# Patient Record
Sex: Male | Born: 1940 | Race: Black or African American | Hispanic: No | Marital: Married | Smoking: Former smoker
Health system: Southern US, Community
[De-identification: ages and names within clinical notes are randomized; demographics above are authoritative.]

## PROBLEM LIST (undated history)

## (undated) DIAGNOSIS — N4289 Other specified disorders of prostate: Secondary | ICD-10-CM

## (undated) DIAGNOSIS — Z960 Presence of urogenital implants: Secondary | ICD-10-CM

## (undated) DIAGNOSIS — J449 Chronic obstructive pulmonary disease, unspecified: Secondary | ICD-10-CM

## (undated) DIAGNOSIS — R339 Retention of urine, unspecified: Secondary | ICD-10-CM

## (undated) DIAGNOSIS — F419 Anxiety disorder, unspecified: Secondary | ICD-10-CM

## (undated) DIAGNOSIS — F329 Major depressive disorder, single episode, unspecified: Secondary | ICD-10-CM

## (undated) DIAGNOSIS — C61 Malignant neoplasm of prostate: Secondary | ICD-10-CM

## (undated) DIAGNOSIS — G610 Guillain-Barre syndrome: Secondary | ICD-10-CM

## (undated) DIAGNOSIS — E119 Type 2 diabetes mellitus without complications: Secondary | ICD-10-CM

## (undated) DIAGNOSIS — Z9189 Other specified personal risk factors, not elsewhere classified: Secondary | ICD-10-CM

## (undated) DIAGNOSIS — F32A Depression, unspecified: Secondary | ICD-10-CM

## (undated) DIAGNOSIS — Z978 Presence of other specified devices: Secondary | ICD-10-CM

## (undated) DIAGNOSIS — N210 Calculus in bladder: Secondary | ICD-10-CM

## (undated) DIAGNOSIS — Z87898 Personal history of other specified conditions: Secondary | ICD-10-CM

## (undated) DIAGNOSIS — I10 Essential (primary) hypertension: Secondary | ICD-10-CM

## (undated) HISTORY — DX: Malignant neoplasm of prostate: C61

## (undated) HISTORY — DX: Anxiety disorder, unspecified: F41.9

## (undated) HISTORY — DX: Depression, unspecified: F32.A

## (undated) HISTORY — DX: Essential (primary) hypertension: I10

## (undated) HISTORY — DX: Personal history of other specified conditions: Z87.898

## (undated) HISTORY — DX: Major depressive disorder, single episode, unspecified: F32.9

---

## 2000-04-22 DIAGNOSIS — G610 Guillain-Barre syndrome: Secondary | ICD-10-CM

## 2000-04-22 HISTORY — PX: CRANIOTOMY FOR TUMOR: SUR345

## 2000-04-22 HISTORY — DX: Guillain-Barre syndrome: G61.0

## 2013-06-14 ENCOUNTER — Ambulatory Visit
Admission: RE | Admit: 2013-06-14 | Discharge: 2013-06-14 | Disposition: A | Discharge status: Home / Self Care | Payer: Commercial Managed Care - HMO | Source: Ambulatory Visit | Attending: Family | Admitting: Family

## 2013-06-14 ENCOUNTER — Other Ambulatory Visit: Payer: Self-pay | Admitting: Family

## 2013-06-14 DIAGNOSIS — M1990 Unspecified osteoarthritis, unspecified site: Secondary | ICD-10-CM

## 2014-08-03 ENCOUNTER — Other Ambulatory Visit: Payer: Self-pay | Admitting: Urology

## 2014-08-17 ENCOUNTER — Encounter (HOSPITAL_BASED_OUTPATIENT_CLINIC_OR_DEPARTMENT_OTHER): Payer: Self-pay | Admitting: *Deleted

## 2014-08-18 NOTE — Progress Notes (Signed)
UNABLE TO REACH PT .  LM ON #[phone] TO ARRIVE AT 0830. NPO AFTER MN AND DO FLEET ENEMA IN AM.

## 2014-08-19 ENCOUNTER — Encounter (HOSPITAL_BASED_OUTPATIENT_CLINIC_OR_DEPARTMENT_OTHER): Payer: Self-pay | Admitting: *Deleted

## 2014-08-19 ENCOUNTER — Ambulatory Visit (HOSPITAL_BASED_OUTPATIENT_CLINIC_OR_DEPARTMENT_OTHER): Payer: Medicare Other | Admitting: Anesthesiology

## 2014-08-19 ENCOUNTER — Other Ambulatory Visit: Payer: Self-pay

## 2014-08-19 ENCOUNTER — Ambulatory Visit (HOSPITAL_BASED_OUTPATIENT_CLINIC_OR_DEPARTMENT_OTHER)
Admission: RE | Admit: 2014-08-19 | Discharge: 2014-08-19 | Disposition: A | Discharge status: Home / Self Care | Payer: Medicare Other | Source: Ambulatory Visit | Attending: Urology | Admitting: Urology

## 2014-08-19 ENCOUNTER — Encounter (HOSPITAL_BASED_OUTPATIENT_CLINIC_OR_DEPARTMENT_OTHER)
Admission: RE | Disposition: A | Discharge status: Home / Self Care | Payer: Self-pay | Source: Ambulatory Visit | Attending: Urology

## 2014-08-19 DIAGNOSIS — F419 Anxiety disorder, unspecified: Secondary | ICD-10-CM | POA: Insufficient documentation

## 2014-08-19 DIAGNOSIS — R351 Nocturia: Secondary | ICD-10-CM | POA: Insufficient documentation

## 2014-08-19 DIAGNOSIS — J45909 Unspecified asthma, uncomplicated: Secondary | ICD-10-CM | POA: Insufficient documentation

## 2014-08-19 DIAGNOSIS — G709 Myoneural disorder, unspecified: Secondary | ICD-10-CM | POA: Diagnosis not present

## 2014-08-19 DIAGNOSIS — N401 Enlarged prostate with lower urinary tract symptoms: Secondary | ICD-10-CM | POA: Insufficient documentation

## 2014-08-19 DIAGNOSIS — Z801 Family history of malignant neoplasm of trachea, bronchus and lung: Secondary | ICD-10-CM | POA: Insufficient documentation

## 2014-08-19 DIAGNOSIS — F17200 Nicotine dependence, unspecified, uncomplicated: Secondary | ICD-10-CM | POA: Diagnosis not present

## 2014-08-19 DIAGNOSIS — R3912 Poor urinary stream: Secondary | ICD-10-CM | POA: Insufficient documentation

## 2014-08-19 DIAGNOSIS — J449 Chronic obstructive pulmonary disease, unspecified: Secondary | ICD-10-CM | POA: Insufficient documentation

## 2014-08-19 DIAGNOSIS — E119 Type 2 diabetes mellitus without complications: Secondary | ICD-10-CM | POA: Insufficient documentation

## 2014-08-19 DIAGNOSIS — Z79899 Other long term (current) drug therapy: Secondary | ICD-10-CM | POA: Insufficient documentation

## 2014-08-19 DIAGNOSIS — R972 Elevated prostate specific antigen [PSA]: Secondary | ICD-10-CM | POA: Diagnosis present

## 2014-08-19 DIAGNOSIS — H409 Unspecified glaucoma: Secondary | ICD-10-CM | POA: Insufficient documentation

## 2014-08-19 DIAGNOSIS — R3915 Urgency of urination: Secondary | ICD-10-CM | POA: Diagnosis not present

## 2014-08-19 DIAGNOSIS — Z8669 Personal history of other diseases of the nervous system and sense organs: Secondary | ICD-10-CM | POA: Diagnosis not present

## 2014-08-19 DIAGNOSIS — C61 Malignant neoplasm of prostate: Secondary | ICD-10-CM | POA: Diagnosis not present

## 2014-08-19 DIAGNOSIS — F329 Major depressive disorder, single episode, unspecified: Secondary | ICD-10-CM | POA: Insufficient documentation

## 2014-08-19 DIAGNOSIS — I10 Essential (primary) hypertension: Secondary | ICD-10-CM | POA: Insufficient documentation

## 2014-08-19 HISTORY — DX: Type 2 diabetes mellitus without complications: E11.9

## 2014-08-19 HISTORY — DX: Guillain-Barre syndrome: G61.0

## 2014-08-19 HISTORY — DX: Other specified disorders of prostate: N42.89

## 2014-08-19 HISTORY — DX: Chronic obstructive pulmonary disease, unspecified: J44.9

## 2014-08-19 HISTORY — PX: PROSTATE BIOPSY: SHX241

## 2014-08-19 LAB — POCT I-STAT 4, (NA,K, GLUC, HGB,HCT)
Glucose, Bld: 114 mg/dL — ABNORMAL HIGH (ref 70–99)
HEMATOCRIT: 38 % — AB (ref 39.0–52.0)
HEMOGLOBIN: 12.9 g/dL — AB (ref 13.0–17.0)
Potassium: 4.1 mmol/L (ref 3.5–5.1)
Sodium: 141 mmol/L (ref 135–145)

## 2014-08-19 LAB — GLUCOSE, CAPILLARY: GLUCOSE-CAPILLARY: 99 mg/dL (ref 70–99)

## 2014-08-19 SURGERY — BIOPSY, PROSTATE, RECTAL APPROACH, WITH US GUIDANCE
Anesthesia: General | Site: Prostate

## 2014-08-19 MED ORDER — LIDOCAINE HCL (CARDIAC) 20 MG/ML IV SOLN
INTRAVENOUS | Status: DC | PRN
  Administered 2014-08-19: 60 mg via INTRAVENOUS

## 2014-08-19 MED ORDER — DEXAMETHASONE SODIUM PHOSPHATE 4 MG/ML IJ SOLN
INTRAMUSCULAR | Status: DC | PRN
  Administered 2014-08-19: 5 mg via INTRAVENOUS

## 2014-08-19 MED ORDER — FENTANYL CITRATE (PF) 100 MCG/2ML IJ SOLN
25.0000 ug | INTRAMUSCULAR | Status: DC | PRN
  Filled 2014-08-19: qty 1

## 2014-08-19 MED ORDER — CEFTRIAXONE SODIUM IN DEXTROSE 20 MG/ML IV SOLN
1.0000 g | INTRAVENOUS | Status: AC
  Administered 2014-08-19: 1 g via INTRAVENOUS
  Filled 2014-08-19: qty 50

## 2014-08-19 MED ORDER — EPHEDRINE SULFATE 50 MG/ML IJ SOLN
INTRAMUSCULAR | Status: DC | PRN
  Administered 2014-08-19: 10 mg via INTRAVENOUS

## 2014-08-19 MED ORDER — ONDANSETRON HCL 4 MG/2ML IJ SOLN
4.0000 mg | Freq: Four times a day (QID) | INTRAMUSCULAR | Status: DC | PRN
  Filled 2014-08-19: qty 2

## 2014-08-19 MED ORDER — PROPOFOL 10 MG/ML IV BOLUS
INTRAVENOUS | Status: DC | PRN
  Administered 2014-08-19: 150 mg via INTRAVENOUS

## 2014-08-19 MED ORDER — FENTANYL CITRATE (PF) 100 MCG/2ML IJ SOLN
INTRAMUSCULAR | Status: DC | PRN
  Administered 2014-08-19: 50 ug via INTRAVENOUS

## 2014-08-19 MED ORDER — CEFTRIAXONE SODIUM 1 G IJ SOLR
INTRAMUSCULAR | Status: AC
  Filled 2014-08-19: qty 10

## 2014-08-19 MED ORDER — LACTATED RINGERS IV SOLN
INTRAVENOUS | Status: DC
  Administered 2014-08-19: 09:00:00 via INTRAVENOUS
  Filled 2014-08-19: qty 1000

## 2014-08-19 MED ORDER — FENTANYL CITRATE (PF) 100 MCG/2ML IJ SOLN
INTRAMUSCULAR | Status: AC
  Filled 2014-08-19: qty 2

## 2014-08-19 MED ORDER — ONDANSETRON HCL 4 MG/2ML IJ SOLN
INTRAMUSCULAR | Status: DC | PRN
  Administered 2014-08-19: 4 mg via INTRAVENOUS

## 2014-08-19 SURGICAL SUPPLY — 18 items
BAG URINE LEG 19OZ MD ST LTX (BAG) IMPLANT
CATH FOLEY 2WAY SLVR  5CC 16FR (CATHETERS)
CATH FOLEY 2WAY SLVR 5CC 16FR (CATHETERS) IMPLANT
DRSG TEGADERM 4X4.75 (GAUZE/BANDAGES/DRESSINGS) IMPLANT
DRSG TEGADERM 8X12 (GAUZE/BANDAGES/DRESSINGS) IMPLANT
DRSG TELFA 3X8 NADH (GAUZE/BANDAGES/DRESSINGS) ×3 IMPLANT
GAUZE SPONGE 4X4 12PLY STRL LF (GAUZE/BANDAGES/DRESSINGS) IMPLANT
GLOVE BIO SURGEON STRL SZ7 (GLOVE) ×3 IMPLANT
NDL SAFETY ECLIPSE 18X1.5 (NEEDLE) IMPLANT
NEEDLE HYPO 18GX1.5 SHARP (NEEDLE)
NEEDLE SPNL 22GX7 QUINCKE BK (NEEDLE) IMPLANT
PLUG CATH AND CAP STER (CATHETERS) IMPLANT
SET IRRIG Y TYPE TUR BLADDER L (SET/KITS/TRAYS/PACK) IMPLANT
SURGILUBE 2OZ TUBE FLIPTOP (MISCELLANEOUS) ×3 IMPLANT
SYR 20CC LL (SYRINGE) IMPLANT
SYRINGE 10CC LL (SYRINGE) IMPLANT
TOWEL OR 17X24 6PK STRL BLUE (TOWEL DISPOSABLE) ×3 IMPLANT
UNDERPAD 30X30 INCONTINENT (UNDERPADS AND DIAPERS) ×3 IMPLANT

## 2014-08-19 NOTE — Discharge Instructions (Signed)

## 2014-08-19 NOTE — Transfer of Care (Signed)
Last Vitals:  Filed Vitals:   08/19/14 0748  BP: 139/81  Pulse: 55  Temp: 36.6 C  Resp: 20    Immediate Anesthesia Transfer of Care Note  Patient: Derek Tanner  Procedure(s) Performed: Procedure(s) (LRB): BIOPSY TRANSRECTAL ULTRASONIC PROSTATE (TUBP) (N/A)  Patient Location: PACU  Anesthesia Type: General  Level of Consciousness: awake, alert  and oriented  Airway & Oxygen Therapy: Patient Spontanous Breathing and Patient connected to nasal cannula oxygen  Post-op Assessment: Report given to PACU RN and Post -op Vital signs reviewed and stable  Post vital signs: Reviewed and stable  Complications: No apparent anesthesia complications

## 2014-08-19 NOTE — Anesthesia Procedure Notes (Addendum)
Date/Time: 08/19/2014 10:30 AM Performed by: Mechele Claude   Procedure Name: LMA Insertion Date/Time: 08/19/2014 10:30 AM Performed by: Mechele Claude Pre-anesthesia Checklist: Patient identified, Emergency Drugs available, Suction available and Patient being monitored Patient Re-evaluated:Patient Re-evaluated prior to inductionOxygen Delivery Method: Circle System Utilized Preoxygenation: Pre-oxygenation with 100% oxygen Intubation Type: IV induction Ventilation: Mask ventilation without difficulty LMA: LMA inserted LMA Size: 4.0 Number of attempts: 1 Airway Equipment and Method: bite block Placement Confirmation: positive ETCO2 Tube secured with: Tape Dental Injury: Teeth and Oropharynx as per pre-operative assessment

## 2014-08-19 NOTE — H&P (Signed)
History of Present Illness Derek Tanner is referred by Dr Iona Beard Osei-Bonsu. His PSA is mildly elevated at 5.6. Free PSA is 7.1%. Derek Tanner relocated to [place] from [place] 5 years ago. He has not had a PSA. He does not have a family history of prostate cancer. He has frequency, urgency, weak stream, nocturia x 4-5. He had a colonoscopy 4 months ago that revealed some benign polys as per his wife.   Past Medical History Problems  1. History of Anxiety (F41.9) 2. 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 History of asthma (Z87.09) 4. History of cardiac arrhythmia (Z86.79) 5. History of depression (Z86.59) 6. History of diabetes mellitus (Z86.39) 7. History of glaucoma (Z86.69) 8. History of hypertension (Z86.79) 9. History of sleep apnea (Z87.09)  Surgical History Problems  1. History of Brain Surgery  Current Meds 1. Lisinopril TABS;  Therapy: (Recorded:31Dec2015) to Recorded 2. Meloxicam TABS;  Therapy: (Recorded:31Dec2015) to Recorded 3. MetFORMIN HCl TABS;  Therapy: (Recorded:31Dec2015) to Recorded 4. ProAir HFA AERS;  Therapy: (Recorded:31Dec2015) to Recorded  Allergies Medication  1. No Known Drug Allergies  Family History Problems  1. Family history of Throat cancer : Brother  Social History Problems    Denied: History of Alcohol use   Caffeine use (F15.90)   Current smoker (F57.200)   Father deceased   Married   Mother deceased  Review of Systems Genitourinary, constitutional, skin, eye, otolaryngeal, hematologic/lymphatic, cardiovascular, pulmonary, endocrine, musculoskeletal, gastrointestinal, neurological and psychiatric system(s) were reviewed and pertinent findings if present are noted and are otherwise negative.  Genitourinary: urinary frequency, feelings of urinary urgency, nocturia, weak urinary stream and erectile dysfunction.  Gastrointestinal: heartburn and diarrhea.  Constitutional: feeling tired (fatigue) and recent  weight loss.  Integumentary: skin rash/lesion and pruritus.  Eyes: blurred vision and diplopia.  ENT: sore throat and sinus problems.  Cardiovascular: chest pain and leg swelling.  Respiratory: shortness of breath and cough.  Musculoskeletal: back pain and joint pain.  Neurological: dizziness.  Psychiatric: anxiety and depression.    Vitals Vital Signs [Data Includes: Last 1 Day]  Recorded: 31Dec2015 10:59AM  Height: 5 ft 7 in Weight: 159 lb  BMI Calculated: 24.9 BSA Calculated: 1.83 Blood Pressure: 131 / 70 Heart Rate: 60 Respiration: 18  Physical Exam Constitutional: Well nourished and well developed . No acute distress.  ENT:. The ears and nose are normal in appearance.  Neck: The appearance of the neck is normal and no neck mass is present.  Pulmonary: No respiratory distress and normal respiratory rhythm and effort.  Cardiovascular: Heart rate and rhythm are normal . No peripheral edema.  Abdomen: The abdomen is soft and nontender. No masses are palpated. No CVA tenderness. No hernias are palpable. No hepatosplenomegaly noted.  Rectal: Rectal exam demonstrates normal sphincter tone, no tenderness and no masses. Estimated prostate size is 2+. The prostate has no nodularity, is indurated involving the left, mid aspect of the prostate which appears to be confined within the prostate capsule and is not tender. The left seminal vesicle is nonpalpable. The right seminal vesicle is nonpalpable. The perineum is normal on inspection.  Genitourinary: Examination of the penis demonstrates no discharge, no masses, no lesions and a normal meatus. The penis is uncircumcised. The scrotum is without lesions. The right epididymis is palpably normal and non-tender. The left epididymis is palpably normal and non-tender. The right testis is non-tender and without masses. The left testis is non-tender and without masses.  Lymphatics: The femoral and inguinal nodes are not enlarged  or tender.  Skin:  Normal skin turgor, no visible rash and no visible skin lesions.  Neuro/Psych:. Mood and affect are appropriate.    Results/Data Urine [Data Includes: Last 1 Day]   [id number]  COLOR YELLOW   APPEARANCE CLEAR   SPECIFIC GRAVITY 1.015   pH 5.5   GLUCOSE NEG mg/dL  BILIRUBIN NEG   KETONE NEG mg/dL  BLOOD NEG   PROTEIN NEG mg/dL  UROBILINOGEN 0.2 mg/dL  NITRITE NEG   LEUKOCYTE ESTERASE NEG    Assessment Assessed  1. Elevated prostate specific antigen (PSA) (R97.2) 2. Benign localized prostatic hyperplasia with lower urinary tract symptoms (LUTS) (N40.1) 3. Nocturia (R35.1)  Plan Elevated prostate specific antigen (PSA)  1. Start: Levofloxacin 500 MG Oral Tablet (Levaquin); 1 tablet the day before the procedure,  1 tablet the day of the procedure, and 1 tablet the day after the procedure 2. PROSTATE BIOPSY ULTRASOUND; Status:Hold For - Date of Service; Requested  NLG:[id number];  3. Follow-up Week x 1 Office  Follow-up 1 week after biopsy  Status: Hold For - Date of  Service  Requested for: 01Feb2016 Health Maintenance  4. UA With REFLEX; [Do Not Release]; Status:Resulted - Requires Verification;   Done:  [id number] 09:31AM  Since his free PSA is low and total PSA is high and the digital rectal examination reveals an abnormal prostate he needs prostate biopsy. The procedure, risks, benefits were explained to the patient and his wife. The risks include but are not limited to hemorrhage: blood in the urine, stools or semen, infection, sepsis. They understand and wish to proceed.

## 2014-08-19 NOTE — Anesthesia Postprocedure Evaluation (Signed)
Anesthesia Post Note  Patient: Derek Tanner  Procedure(s) Performed: Procedure(s) (LRB): BIOPSY TRANSRECTAL ULTRASONIC PROSTATE (TUBP) (N/A)  Anesthesia type: General  Patient location: PACU  Post pain: Pain level controlled and Adequate analgesia  Post assessment: Post-op Vital signs reviewed, Patient's Cardiovascular Status Stable, Respiratory Function Stable, Patent Airway and Pain level controlled  Last Vitals:  Filed Vitals:   08/19/14 1115  BP: 132/56  Pulse: 55  Temp:   Resp: 13    Post vital signs: Reviewed and stable  Level of consciousness: awake, alert  and oriented  Complications: No apparent anesthesia complications

## 2014-08-19 NOTE — Op Note (Signed)
Derek Tanner is a 73 y.o.   08/19/2014  General  Preop diagnosis: Elevated PSA, abnormal digital rectal examination  Postop diagnosis: Same  Procedure done: Ultrasound guided prostate biopsy  Surgeon: Charlene Brooke. [name]  Technologist: Slaton Lions  Indication: Patient is a 74 years old male with an elevated PSA of 5.6 and 7% free PSA. Digital rectal examination showed an indurated left lobe of the prostate. He was scheduled for a prostate biopsy under local anesthesia. However he could not tolerate the transducer. He is now scheduled for ultrasound-guided prostate biopsy under general anesthesia  Procedure: The patient was identified by his wrist band and proper timeout was taken.  Under general anesthesia he was prepped and draped and placed in the left lateral decubitus position. A transducer was inserted in the rectum. The seminal vesicles appear normal. There is no evidence of hypoechoic nodules. The prostate measures a 4.46 cm in width, 3.5 cm in height and 4.5 cm in length. The volume of the prostate is 38.03 mL.  2 biopsies of the right base, 2 biopsies of the right mid gland, 2 biopsies of the right apex were done. The biopsies were done at the medial and lateral zones of the prostate. Then 2 biopsies of the left base, 2 biopsies of the left mid gland and 2 biopsies of the left apex were done.  There was no bleeding at the end of the procedure. The transducer was removed.  The patient tolerated the procedure well and left the OR in satisfactory condition to postanesthesia care unit.  EBL: Minimal  Needles, sponges count: Correct

## 2014-08-19 NOTE — Anesthesia Preprocedure Evaluation (Signed)
Anesthesia Evaluation  Patient identified by MRN, date of birth, ID band Patient awake    Reviewed: Allergy & Precautions, NPO status , Patient's Chart, lab work & pertinent test results  Airway Mallampati: II   Neck ROM: full    Dental   Pulmonary COPDCurrent Smoker,  breath sounds clear to auscultation        Cardiovascular negative cardio ROS  Rhythm:regular Rate:Normal     Neuro/Psych  Neuromuscular disease    GI/Hepatic   Endo/Other  diabetes, Type 2  Renal/GU      Musculoskeletal   Abdominal   Peds  Hematology   Anesthesia Other Findings   Reproductive/Obstetrics                             Anesthesia Physical Anesthesia Plan  ASA: II  Anesthesia Plan: General   Post-op Pain Management:    Induction: Intravenous  Airway Management Planned: LMA  Additional Equipment:   Intra-op Plan:   Post-operative Plan:   Informed Consent: I have reviewed the patients History and Physical, chart, labs and discussed the procedure including the risks, benefits and alternatives for the proposed anesthesia with the patient or authorized representative who has indicated his/her understanding and acceptance.     Plan Discussed with: CRNA, Anesthesiologist and Surgeon  Anesthesia Plan Comments:         Anesthesia Quick Evaluation

## 2014-08-22 ENCOUNTER — Encounter (HOSPITAL_BASED_OUTPATIENT_CLINIC_OR_DEPARTMENT_OTHER): Payer: Self-pay | Admitting: Urology

## 2014-09-08 ENCOUNTER — Other Ambulatory Visit: Payer: Self-pay | Admitting: Physician Assistant

## 2014-09-08 DIAGNOSIS — M542 Cervicalgia: Secondary | ICD-10-CM

## 2014-09-13 ENCOUNTER — Encounter: Payer: Self-pay | Admitting: Radiation Oncology

## 2014-09-13 NOTE — Progress Notes (Signed)
GU Location of Tumor / Histology: prostatic adenocarcinoma  If Prostate Cancer, Gleason Score is (4 + 3) and PSA is (5.6)  Derek Tanner was referred by Dr. Vista Lawman to Dr. Janice Norrie in April 2016 for a mildly elevated PSA. Derek Tanner relocated to [place] from [place] five years ago.  Biopsies of prostate (if applicable) revealed:  Diagnosis 1. Prostate, needle biopsy(ies), right base lateral - PROSTATIC ADENOCARCINOMA, GLEASON'S SCORE 3+4=7, PROGNOSTIC GRADE GROUP II, INVOLVING 45% OF THE TISSUE, ONE OF ONE CORE. - NO PERINEURAL INVASION IDENTIFIED. 2. Prostate, needle biopsy(ies), right base medial - PROSTATIC ADENOCARCINOMA, GLEASON'S SCORE 3+4=7, PROGNOSTIC GRADE GROUP II., INVOLVING 40% OF THE TISSUE, ONE OF ONE CORE. - NO PERINEURAL INVASION IDENTIFIED. 3. Prostate, needle biopsy(ies), right mid lateral - PROSTATIC ADENOCARCINOMA, GLEASON'S SCORE 3+4=7, PROGNOSTIC GRADE GROUP II, INVOLVING 35% OF THE TISSUE, ONE OF ONE CORE. - NO PERINEURAL INVASION IDENTIFIED. 4. Prostate, needle biopsy(ies), right mid medial - PROSTATIC ADENOCARCINOMA, GLEASON'S SCORE 3+4=7, PROGNOSTIC GRADE GROUP II, INVOLVING LESS THAN 10% OF TISSUE, ONE OF ONE CORE. - NO PERINEURAL INVASION IDENTIFIED. 5. Prostate, needle biopsy(ies), right apex lateral - PROSTATIC ADENOCARCINOMA, GLEASON'S SCORE 3+4=7, PROGNOSTIC GRADE GROUP II, INVOLVING 35% OF THE TISSUE, ONE OF ONE CORE. - NO PERINEURAL INVASION IDENTIFIED. 6. Prostate, needle biopsy(ies), right apex medial - PROSTATIC ADENOCARCINOMA, GLEASON'S SCORE 3+4=7, PROGNOSTIC GRADE GROUP II, INVOLVING 30% OF THE TISSUE, ONE OF ONE CORE. - NO PERINEURAL INVASION IDENTIFIED. 7. Prostate, needle biopsy(ies), left base lateral - PROSTATIC ADENOCARCINOMA, GLEASON'S SCORE 4+3=7, PROGNOSTIC GRADE GROUP III, INVOLVING LESS THAN 10% OF THE TISSUE, ONE OF ONE CORE. - PERINEURAL INVASION PRESENT. 8. Prostate, needle biopsy(ies), left base medial - MICROSCOPIC  FOCUS OF PROSTATIC ADENOCARCINOMA, GLEASON'S SCORE 3+4=7, PROGNOSTIC GRADE GROUP II, INVOLVING LESS THAN 5% OF THE TISSUE, ONE OF ONE CORE. 1 of 3 - PERINEURAL INVASION PRESENT. 9. Prostate, needle biopsy(ies), left mid lateral - PROSTATIC ADENOCARCINOMA, GLEASON'S SCORE 3+4=7, PROGNOSTIC GRADE GROUP II, INVOLVING 25% OF THE TISSUE, ONE OF ONE CORE, WITH PERINEURAL INVASION PRESENT AND FOCAL AREA EXTENDING INTO THE PERIPROSTATIC FATTY TISSUE; PLEASE SEE COMMENT. 10. Prostate, needle biopsy(ies), left mid medial - PROSTATIC ADENOCARCINOMA, GLEASON'S SCORE 4+3=7, PROGNOSTIC GRADE GROUP III, INVOLVING 35% OF THE TISSUE, ONE OF ONE CORE, WITH EXTENSIVE PERINEURAL INVASION PRESENT. 11. Prostate, needle biopsy(ies), left apex lateral - PROSTATIC ADENOCARCINOMA, GLEASON 'S SCORE 4+3=7, PROGNOSTIC GRADE GROUP III, INVOLVING 90% OF THE TISSUE, ONE OF ONE CO RE. - EXTENSIVE PERINEURAL INVASION PRESENT. 12. Prostate, needle biopsy(ies), left apex medial - PROSTATIC ADENOCARCINOMA, GLEASON'S SCORE 4+3=7, PROGNOSTIC GRADE GROUP III, INVOLVING 80% OF THE TISSUE, ONE OF ONE CORE. - PERINEURAL INVASION PRESENT.  Past/Anticipated interventions by urology, if any: biopsy and referral to radiation oncology  Past/Anticipated interventions by medical oncology, if any: no  Weight changes, if any: no  Bowel/Bladder complaints, if any: frequency, urgency, weak stream, nocturia x 4-5   Nausea/Vomiting, if any: no  Pain issues, if any:  yes, back and joint pain  SAFETY ISSUES:  Prior radiation? no  Pacemaker/ICD? no  Possible current pregnancy? no  Is the patient on methotrexate? no  Current Complaints / other details:  74 year old male. Married. NKDA. Prostate volume 38.03 mL.

## 2014-09-14 ENCOUNTER — Ambulatory Visit
Admission: RE | Admit: 2014-09-14 | Discharge: 2014-09-14 | Disposition: A | Discharge status: Home / Self Care | Payer: Medicare Other | Source: Ambulatory Visit | Attending: Radiation Oncology | Admitting: Radiation Oncology

## 2014-09-14 VITALS — BP 129/64 | HR 65 | Resp 16 | Ht 67.0 in | Wt 166.9 lb

## 2014-09-14 DIAGNOSIS — C61 Malignant neoplasm of prostate: Secondary | ICD-10-CM | POA: Insufficient documentation

## 2014-09-14 NOTE — Progress Notes (Addendum)
     PSA 03/24/14     5.6  DRE revealed an indurated left lobe  08/19/14  TRUS under anesthesia 38.03 mL   Out of 12 biopsies, 12 were positive     4+3

## 2014-09-14 NOTE — Progress Notes (Signed)
See progress note under physician encounter. 

## 2014-09-14 NOTE — Progress Notes (Signed)
Radiation Oncology         [phone] ________________________________  Initial outpatient Consultation  Name: Derek Tanner MRN: [id number]  Date: 09/14/2014  DOB: 27-Feb-1941  CC:No primary care provider on file.  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    ICD-9-CM ICD-10-CM   1. Malignant neoplasm of prostate 185 C61     HISTORY OF PRESENT ILLNESS:Derek Tanner is a 74 y.o. gentleman.  He was noted to have an elevated PSA of 5.6 by his primary care physician, Dr. Vista Lawman on 03/24/2014.  Accordingly, he was referred for evaluation in urology by Dr. Janice Norrie on 04/21/2014,  digital rectal examination was performed at that time revealing an induratee left lobe.  The patient proceeded to transrectal ultrasound with 12 biopsies of the prostate on 08/19/2014.  The prostate volume measured 38.03 cc.  Out of 12 core biopsies,12 were positive.  The maximum Gleason score was 4+3, and this was seen in 4 specimens from the left side.  The patient reviewed the biopsy results with his urologist and he has kindly been referred today for discussion of potential radiation treatment options.  PREVIOUS RADIATION THERAPY: No  PAST MEDICAL HISTORY:  has a past medical history of Type 2 diabetes mellitus; Elevated PSA; Prostate induration; COPD (chronic obstructive pulmonary disease); Guillain-Barre disease (2002); Prostate cancer; Anxiety; Guillain-Barre syndrome; Asthma; History of prolonged Q-T interval on ECG; Depression; Hypertension; and Sleep apnea.    PAST SURGICAL HISTORY: Past Surgical History  Procedure Laterality Date  . Craniotomy for tumor  2002  . Prostate biopsy N/A 08/19/2014    Procedure: BIOPSY TRANSRECTAL ULTRASONIC PROSTATE (TUBP);  Surgeon: Lowella Bandy, MD;  Location: [facility];  Service: Urology;   Laterality: N/A;    FAMILY HISTORY: family history includes Cancer in his brother.  SOCIAL HISTORY:  reports that he has been smoking.  He has never used smokeless tobacco. He reports that he does not drink alcohol or use illicit drugs.  ALLERGIES: Review of patient's allergies indicates no known allergies.  MEDICATIONS:  Current Outpatient Prescriptions  Medication Sig Dispense Refill  . albuterol (PROVENTIL) (2.5 MG/3ML) 0.083% nebulizer solution Take 2.5 mg by nebulization as needed for wheezing or shortness of breath.    . Blood Glucose Monitoring Suppl (RA BLOOD GLUCOSE MONITOR) DEVI Frequency:3-4 times a day   Dosage:0.0     Instructions:Accu-Chek Aviva Plus, Use 1 Strip 3-4 times a day  Note:Include lancets, lancet device & control solution; diabetes 250.00    . fluticasone (FLONASE) 50 MCG/ACT nasal spray Place 1 spray into both nostrils as needed for allergies or rhinitis.    Marland Kitchen HYDROcodone-acetaminophen (NORCO) 7.5-325 MG per tablet Take 1 tablet by mouth every 6 (six) hours as needed.  0  . lisinopril (PRINIVIL,ZESTRIL) 10 MG tablet Take 10 mg by mouth daily.  1  . lisinopril (PRINIVIL,ZESTRIL) 30 MG tablet Take 30 mg by mouth.    . meloxicam (MOBIC) 7.5 MG tablet Take 7.5-15 mg by mouth.    . metFORMIN (GLUCOPHAGE) 500 MG tablet Take 500 mg by mouth 2 (two) times daily with a meal.    . terazosin (HYTRIN) 5 MG capsule   1  . triamcinolone cream (KENALOG) 0.1 % Apply topically.    . VENTOLIN HFA 108 (90 BASE) MCG/ACT inhaler   2  . tamsulosin (FLOMAX)  0.4 MG CAPS capsule Take 0.4 mg by mouth.     No current facility-administered medications for this encounter.    REVIEW OF SYSTEMS:  A 15 point review of systems is documented in the electronic medical record. This was obtained by the nursing staff. However, I reviewed this with the patient to discuss relevant findings and make appropriate changes.  A comprehensive review of systems was negative..  The patient completed an IPSS  and IIEF questionnaire.  His IPSS score was 22 indicating severe urinary outflow obstructive symptoms.  He indicated that his erectile function is unable to complete sexual activity.   PHYSICAL EXAM: This patient is in no acute distress.  He is alert and oriented.   height is 5\' 7"  (1.702 m) and weight is 166 lb 14.4 oz (75.705 kg). His blood pressure is 129/64 and his pulse is 65. His respiration is 16.  He exhibits no respiratory distress or labored breathing.  He appears neurologically intact.  His mood is pleasant.  His affect is appropriate.  Please note the digital rectal exam findings described above.  KPS = 100  100 - Normal; no complaints; no evidence of disease. 90   - Able to carry on normal activity; minor signs or symptoms of disease. 80   - Normal activity with effort; some signs or symptoms of disease. 62   - Cares for self; unable to carry on normal activity or to do active work. 60   - Requires occasional assistance, but is able to care for most of his personal needs. 50   - Requires considerable assistance and frequent medical care. 34   - Disabled; requires special care and assistance. 19   - Severely disabled; hospital admission is indicated although death not imminent. 24   - Very sick; hospital admission necessary; active supportive treatment necessary. 10   - Moribund; fatal processes progressing rapidly. 0     - Dead  Karnofsky DA, Abelmann Spaulding, Craver LS and Burchenal [facility] [phone]) The use of the nitrogen mustards in the palliative treatment of carcinoma: with particular reference to bronchogenic carcinoma Cancer 1 634-56   LABORATORY DATA:  Lab Results  Component Value Date   HGB 12.9* 08/19/2014   HCT 38.0* 08/19/2014   Lab Results  Component Value Date   NA 141 08/19/2014   K 4.1 08/19/2014   No results found for: ALT, AST, GGT, ALKPHOS, BILITOT   RADIOGRAPHY: No results found.    IMPRESSION: Derek Tanner is a 74 year old gentleman with stage T2a  adenocarcinoma of the prostate with a Gleason's score of 4+3 and a PSA of 5.6. His T-Stage, Gleason's Score, and PSA put him into the intermediate risk group.  Accordingly he is eligible for a variety of potential treatment options including surgery, external radiation treatments and hormone therapy.  PLAN: Today I reviewed the findings and workup thus far.  We discussed the natural history of prostate cancer.  We reviewed the the implications of T-stage, Gleason's Score, and PSA on decision-making and outcomes in prostate cancer.  We discussed radiation treatment in the management of prostate cancer with regard to the logistics and delivery of external beam radiation treatment as well as the logistics and delivery of prostate brachytherapy.  We compared and contrasted each of these approaches and also compared these against prostatectomy.  The patient expressed interest in external beam radiotherapy.  I filled out a patient counseling form for him with relevant treatment diagrams and we retained a copy for our  records.   We discussed hormone therapy in terms of side effects and potential benefits in conjunction with radiation.  The patient would like to proceed with prostate IMRT.  I will share my findings with Dr. Janice Norrie and move forward with scheduling initiation of hormone therapy and placement of three gold fiducial markers into the prostate to proceed with IMRT in the near future.     I enjoyed meeting with him today, and will look forward to participating in the care of this very nice gentleman.  Pursue radiation treatments and hormone therapy. Two hormone shots administered over a course of 6 months.  I spent 40 minutes face to face with the patient and more than 50% of that time was spent in counseling and/or coordination of care.  This document serves as a record of services personally performed by Tyler Pita, MD. It was created on his behalf by Lenn Cal, a trained medical scribe.  The creation of this record is based on the scribe's personal observations and the provider's statements to them. This document has been checked and approved by the attending provider.     ------------------------------------------------  Sheral Apley Tammi Klippel, M.D.

## 2014-09-27 ENCOUNTER — Other Ambulatory Visit: Payer: Medicare Other

## 2014-09-28 ENCOUNTER — Inpatient Hospital Stay: Admission: RE | Admit: 2014-09-28 | Payer: Medicare Other | Source: Ambulatory Visit

## 2014-10-03 ENCOUNTER — Ambulatory Visit
Admission: RE | Admit: 2014-10-03 | Discharge: 2014-10-03 | Disposition: A | Discharge status: Home / Self Care | Payer: Medicare Other | Source: Ambulatory Visit | Attending: Physician Assistant | Admitting: Physician Assistant

## 2014-10-03 DIAGNOSIS — M542 Cervicalgia: Secondary | ICD-10-CM

## 2014-10-11 ENCOUNTER — Telehealth: Payer: Self-pay | Admitting: *Deleted

## 2014-10-11 NOTE — Telephone Encounter (Signed)
CALLED PATIENT TO INFORM OF HORMONE INJ. FOR 10-13-14 - ARRIVAL TIME - 1:30 PM, GOLD SEED PLACEMENT ON 11-10-14 - ARRIVAL TIME - 7:45 AM @ DR. Janice Norrie' OFFICE, SIM APPT. - 12-02-14 @ 1 PM @ DR. MANNING'S OFFICE AND HE WILL START RT ON 12-13-14, NO ANSWER WILL CALL  LATER.

## 2014-10-17 ENCOUNTER — Other Ambulatory Visit: Payer: Self-pay | Admitting: Urology

## 2014-10-18 ENCOUNTER — Other Ambulatory Visit: Payer: Self-pay | Admitting: Urology

## 2014-10-26 ENCOUNTER — Encounter: Payer: Self-pay | Admitting: Radiation Oncology

## 2014-10-26 NOTE — Progress Notes (Signed)
October 13, 2014  Lupron scheduled November 10, 2014  Gold Seed placement December 02, 2014 CT/Simulation December 13, 2014  Radiation treatment

## 2014-11-08 ENCOUNTER — Encounter (HOSPITAL_BASED_OUTPATIENT_CLINIC_OR_DEPARTMENT_OTHER): Payer: Self-pay | Admitting: *Deleted

## 2014-11-09 ENCOUNTER — Encounter (HOSPITAL_BASED_OUTPATIENT_CLINIC_OR_DEPARTMENT_OTHER): Payer: Self-pay | Admitting: *Deleted

## 2014-11-09 NOTE — Progress Notes (Signed)
   11/09/14 1017  OBSTRUCTIVE SLEEP APNEA  Have you ever been diagnosed with sleep apnea through a sleep study? No  Do you snore loudly (loud enough to be heard through closed doors)?  0  Do you often feel tired, fatigued, or sleepy during the daytime? 0  Has anyone observed you stop breathing during your sleep? 1  Do you have, or are you being treated for high blood pressure? 1  BMI more than 35 kg/m2? 0  Age over 73 years old? 1  Neck circumference greater than 40 cm/16 inches? 0  Gender: 1  Obstructive Sleep Apnea Score 4

## 2014-11-09 NOTE — Progress Notes (Signed)
NPO AFTER MN.  ARRIVE AT 0715.  NEEDS ISTAT.  CURRENT EKG IN CHART AND EPIC. WILL BRING RESCUE INHALER.

## 2014-11-10 NOTE — H&P (Signed)
  History of Present Illness Mr Asman returns with his wife to discuss treatment of his prostate cancer. He has Gleason 3+4 and 4+3 in all cores. PSA is 5.6. 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 As per the [facility] nomograms he has 38% probability of organ confined disease, 59% risk of extracapsular extension, 8% risk of seminal vesicle invasion and 8% risk of lymph node involvement. I went over the treatment options with them. He is not a candidate for active surveillance. The options are: radical prostatectomy. They understand that he has a high risk of extraprostatic extension and in that case would need adjuvant radiation therapy, IMRT, cryoablation, HIFU. He is not a candidate for seeds implantation alone.  He is interested in radiation therapy. He will need 6-12 months of neoadjuvant androgen deprivation. He would like to have radiation therapy.   Past Medical History Problems  1. History of Anxiety (F41.9) 2. 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 History of asthma (Z87.09) 4. History of cardiac arrhythmia (Z86.79) 5. History of depression (Z86.59) 6. History of diabetes mellitus (Z86.39) 7. History of glaucoma (Z86.69) 8. History of hypertension (Z86.79) 9. History of sleep apnea (Z87.09)  Surgical History Problems  1. History of Biopsy Of The Prostate Needle 2. History of Brain Surgery  Current Meds 1. Lisinopril TABS;  Therapy: (Recorded:31Dec2015) to Recorded 2. Meloxicam TABS;  Therapy: (Recorded:31Dec2015) to Recorded 3. MetFORMIN HCl TABS;  Therapy: (Recorded:31Dec2015) to Recorded 4. ProAir HFA AERS;  Therapy: (Recorded:31Dec2015) to Recorded 5. Uribel 118 MG Oral Capsule; TAKE 1 CAPSULE 4 times daily;  Therapy: 12Apr2016 to (Last Rx:12Apr2016)  Requested for: 12Apr2016 Ordered  Allergies Medication  1. No Known Drug Allergies  Family History Problems  1. Family history of Throat cancer : Brother  Social History Problems  1.  Denied: History of Alcohol use 2. Caffeine use (F15.90) 3. Current smoker (F17.200) 4. Father deceased 48. Married 6. Mother deceased  Review of Systems Genitourinary, constitutional, skin, eye, otolaryngeal, hematologic/lymphatic, cardiovascular, pulmonary, endocrine, musculoskeletal, gastrointestinal, neurological and psychiatric system(s) were reviewed and pertinent findings if present are noted and are otherwise negative.    Physical Exam Constitutional: Well nourished and well developed . No acute distress.  HEENT: Normal Lungs: clear to percussion and auscultation Heart: Regular rhythm Abdomen: Soft, non distended, non tender. No CVA tenderness.  Bowel sounds: normal Genitalia: Penis and scrotal contents are within normal limits. Extremities: WNL.  No pedal edema.  No deformities. Rectal: Sphincter tone: normal.  Prostate enlarged> No nodules.  Seminal vesicles " not palpable.  Results/Data Urine [Data Includes: Last 1 Day]   [id number] COLOR YELLOW  APPEARANCE CLEAR  SPECIFIC GRAVITY 1.025  pH 5.0  GLUCOSE NEG mg/dL BILIRUBIN NEG  KETONE NEG mg/dL BLOOD NEG  PROTEIN NEG mg/dL UROBILINOGEN 0.2 mg/dL NITRITE NEG  LEUKOCYTE ESTERASE TRACE  SQUAMOUS EPITHELIAL/HPF RARE  WBC 0-2 WBC/hpf RBC 0-2 RBC/hpf BACTERIA NONE SEEN  CRYSTALS NONE SEEN  CASTS NONE SEEN   Assessment Assessed  1. Adenocarcinoma of prostate (C61)  Plan Adenocarcinoma of prostate  Patient selected IMRT as form of treatment.  He is scheduled for gold seeds placement.

## 2014-11-11 ENCOUNTER — Encounter (HOSPITAL_BASED_OUTPATIENT_CLINIC_OR_DEPARTMENT_OTHER): Payer: Self-pay | Admitting: *Deleted

## 2014-11-11 ENCOUNTER — Ambulatory Visit (HOSPITAL_BASED_OUTPATIENT_CLINIC_OR_DEPARTMENT_OTHER): Payer: Medicare Other | Admitting: Anesthesiology

## 2014-11-11 ENCOUNTER — Encounter (HOSPITAL_BASED_OUTPATIENT_CLINIC_OR_DEPARTMENT_OTHER)
Admission: RE | Disposition: A | Discharge status: Home / Self Care | Payer: Self-pay | Source: Ambulatory Visit | Attending: Urology

## 2014-11-11 ENCOUNTER — Ambulatory Visit (HOSPITAL_BASED_OUTPATIENT_CLINIC_OR_DEPARTMENT_OTHER)
Admission: RE | Admit: 2014-11-11 | Discharge: 2014-11-11 | Disposition: A | Discharge status: Home / Self Care | Payer: Medicare Other | Source: Ambulatory Visit | Attending: Urology | Admitting: Urology

## 2014-11-11 DIAGNOSIS — F17200 Nicotine dependence, unspecified, uncomplicated: Secondary | ICD-10-CM | POA: Diagnosis not present

## 2014-11-11 DIAGNOSIS — Z79899 Other long term (current) drug therapy: Secondary | ICD-10-CM | POA: Insufficient documentation

## 2014-11-11 DIAGNOSIS — C61 Malignant neoplasm of prostate: Secondary | ICD-10-CM | POA: Diagnosis present

## 2014-11-11 DIAGNOSIS — J449 Chronic obstructive pulmonary disease, unspecified: Secondary | ICD-10-CM | POA: Diagnosis not present

## 2014-11-11 DIAGNOSIS — Z791 Long term (current) use of non-steroidal anti-inflammatories (NSAID): Secondary | ICD-10-CM | POA: Insufficient documentation

## 2014-11-11 DIAGNOSIS — I10 Essential (primary) hypertension: Secondary | ICD-10-CM | POA: Insufficient documentation

## 2014-11-11 DIAGNOSIS — E119 Type 2 diabetes mellitus without complications: Secondary | ICD-10-CM | POA: Insufficient documentation

## 2014-11-11 DIAGNOSIS — J45909 Unspecified asthma, uncomplicated: Secondary | ICD-10-CM | POA: Diagnosis not present

## 2014-11-11 DIAGNOSIS — H409 Unspecified glaucoma: Secondary | ICD-10-CM | POA: Diagnosis not present

## 2014-11-11 DIAGNOSIS — G4730 Sleep apnea, unspecified: Secondary | ICD-10-CM | POA: Insufficient documentation

## 2014-11-11 HISTORY — PX: GOLD SEED IMPLANT: SHX6343

## 2014-11-11 HISTORY — DX: Other specified personal risk factors, not elsewhere classified: Z91.89

## 2014-11-11 LAB — POCT I-STAT 4, (NA,K, GLUC, HGB,HCT)
Glucose, Bld: 139 mg/dL — ABNORMAL HIGH (ref 65–99)
HCT: 37 % — ABNORMAL LOW (ref 39.0–52.0)
Hemoglobin: 12.6 g/dL — ABNORMAL LOW (ref 13.0–17.0)
Potassium: 3.9 mmol/L (ref 3.5–5.1)
SODIUM: 141 mmol/L (ref 135–145)

## 2014-11-11 LAB — GLUCOSE, CAPILLARY: GLUCOSE-CAPILLARY: 109 mg/dL — AB (ref 65–99)

## 2014-11-11 SURGERY — INSERTION, GOLD SEEDS
Anesthesia: General | Site: Prostate

## 2014-11-11 MED ORDER — GLYCOPYRROLATE 0.2 MG/ML IJ SOLN
INTRAMUSCULAR | Status: DC | PRN
  Administered 2014-11-11: 0.2 mg via INTRAVENOUS

## 2014-11-11 MED ORDER — CEFTRIAXONE SODIUM IN DEXTROSE 40 MG/ML IV SOLN
2.0000 g | INTRAVENOUS | Status: AC
  Administered 2014-11-11: 2 g via INTRAVENOUS
  Filled 2014-11-11: qty 50

## 2014-11-11 MED ORDER — LIDOCAINE HCL (CARDIAC) 20 MG/ML IV SOLN
INTRAVENOUS | Status: DC | PRN
  Administered 2014-11-11: 20 mg via INTRAVENOUS

## 2014-11-11 MED ORDER — PROPOFOL 10 MG/ML IV BOLUS
INTRAVENOUS | Status: DC | PRN
  Administered 2014-11-11: 150 mg via INTRAVENOUS

## 2014-11-11 MED ORDER — CEFTRIAXONE SODIUM 2 G IJ SOLR
INTRAMUSCULAR | Status: AC
  Filled 2014-11-11: qty 2

## 2014-11-11 MED ORDER — FENTANYL CITRATE (PF) 100 MCG/2ML IJ SOLN
INTRAMUSCULAR | Status: AC
  Filled 2014-11-11: qty 4

## 2014-11-11 MED ORDER — FENTANYL CITRATE (PF) 100 MCG/2ML IJ SOLN
25.0000 ug | INTRAMUSCULAR | Status: DC | PRN
  Filled 2014-11-11: qty 1

## 2014-11-11 MED ORDER — CEFTRIAXONE SODIUM IN DEXTROSE 20 MG/ML IV SOLN
1.0000 g | INTRAVENOUS | Status: DC
  Filled 2014-11-11: qty 50

## 2014-11-11 MED ORDER — KETOROLAC TROMETHAMINE 30 MG/ML IJ SOLN
30.0000 mg | Freq: Once | INTRAMUSCULAR | Status: DC | PRN
  Filled 2014-11-11: qty 1

## 2014-11-11 MED ORDER — LACTATED RINGERS IV SOLN
INTRAVENOUS | Status: DC
  Administered 2014-11-11: 08:00:00 via INTRAVENOUS
  Filled 2014-11-11: qty 1000

## 2014-11-11 MED ORDER — FENTANYL CITRATE (PF) 100 MCG/2ML IJ SOLN
INTRAMUSCULAR | Status: DC | PRN
  Administered 2014-11-11: 25 ug via INTRAVENOUS

## 2014-11-11 MED ORDER — PROMETHAZINE HCL 25 MG/ML IJ SOLN
6.2500 mg | INTRAMUSCULAR | Status: DC | PRN
  Filled 2014-11-11: qty 1

## 2014-11-11 SURGICAL SUPPLY — 10 items
GAUZE SPONGE 4X4 12PLY STRL LF (GAUZE/BANDAGES/DRESSINGS) IMPLANT
GLOVE BIO SURGEON STRL SZ7 (GLOVE) ×2 IMPLANT
GOLD MARKER (BRACHYTHERAPY NEEDLE) ×6 IMPLANT
MARKER GOLD PRELOAD 1.2X3 (Urological Implant) ×3 IMPLANT
NDL SAFETY ECLIPSE 18X1.5 (NEEDLE) IMPLANT
NEEDLE HYPO 18GX1.5 SHARP (NEEDLE)
NEEDLE SPNL 22GX7 QUINCKE BK (NEEDLE) IMPLANT
SEED GOLD PRELOAD 1.2X3 (Urological Implant) ×6 IMPLANT
SURGILUBE 2OZ TUBE FLIPTOP (MISCELLANEOUS) ×2 IMPLANT
UNDERPAD 30X30 INCONTINENT (UNDERPADS AND DIAPERS) ×4 IMPLANT

## 2014-11-11 NOTE — Discharge Instructions (Signed)
Call your surgeon if you experience:   1.  Fever over 101.0. 2.  Inability to urinate. 3.  Nausea and/or vomiting. 4.  Extreme swelling or bruising at the surgical site. 5.  Any problems or concerns     Post Anesthesia Home Care Instructions  Activity: Get plenty of rest for the remainder of the day. A responsible adult should stay with you for 24 hours following the procedure.  For the next 24 hours, DO NOT: -Drive a car -Paediatric nurse -Drink alcoholic beverages -Take any medication unless instructed by your physician -Make any legal decisions or sign important papers.  Meals: Start with liquid foods such as gelatin or soup. Progress to regular foods as tolerated. Avoid greasy, spicy, heavy foods. If nausea and/or vomiting occur, drink only clear liquids until the nausea and/or vomiting subsides. Call your physician if vomiting continues.  Special Instructions/Symptoms: Your throat may feel dry or sore from the anesthesia or the breathing tube placed in your throat during surgery. If this causes discomfort, gargle with warm salt water. The discomfort should disappear within 24 hours.  If you had a scopolamine patch placed behind your ear for the management of post- operative nausea and/or vomiting:  1. The medication in the patch is effective for 72 hours, after which it should be removed.  Wrap patch in a tissue and discard in the trash. Wash hands thoroughly with soap and water. 2. You may remove the patch earlier than 72 hours if you experience unpleasant side effects which may include dry mouth, dizziness or visual disturbances. 3. Avoid touching the patch. Wash your hands with soap and water after contact with the patch.

## 2014-11-11 NOTE — Transfer of Care (Signed)
Immediate Anesthesia Transfer of Care Note  Patient: Derek Tanner  Procedure(s) Performed: Procedure(s) (LRB): GOLD SEED IMPLANT (N/A)  Patient Location: PACU  Anesthesia Type: General  Level of Consciousness: awake, oriented, sedated and patient cooperative  Airway & Oxygen Therapy: Patient Spontanous Breathing and Patient connected to face mask oxygen  Post-op Assessment: Report given to PACU RN and Post -op Vital signs reviewed and stable  Post vital signs: Reviewed and stable  Complications: No apparent anesthesia complications

## 2014-11-11 NOTE — Anesthesia Postprocedure Evaluation (Signed)
  Anesthesia Post-op Note  Patient: Derek Tanner  Procedure(s) Performed: Procedure(s) (LRB): GOLD SEED IMPLANT (N/A)  Patient Location: PACU  Anesthesia Type: General  Level of Consciousness: awake and alert   Airway and Oxygen Therapy: Patient Spontanous Breathing  Post-op Pain: mild  Post-op Assessment: Post-op Vital signs reviewed, Patient's Cardiovascular Status Stable, Respiratory Function Stable, Patent Airway and No signs of Nausea or vomiting  Last Vitals:  Filed Vitals:   11/11/14 1015  BP:   Pulse: 57  Temp:   Resp: 15    Post-op Vital Signs: stable   Complications: No apparent anesthesia complications

## 2014-11-11 NOTE — Anesthesia Preprocedure Evaluation (Signed)
Anesthesia Evaluation  Patient identified by MRN, date of birth, ID band Patient awake    Reviewed: Allergy & Precautions, NPO status , Patient's Chart, lab work & pertinent test results  Airway Mallampati: II  TM Distance: >3 FB Neck ROM: Full    Dental no notable dental hx.    Pulmonary COPD COPD inhaler, former smoker,  breath sounds clear to auscultation  Pulmonary exam normal       Cardiovascular hypertension, Normal cardiovascular examRhythm:Regular Rate:Normal     Neuro/Psych negative neurological ROS  negative psych ROS   GI/Hepatic negative GI ROS, Neg liver ROS,   Endo/Other  diabetes, Type 2  Renal/GU negative Renal ROS  negative genitourinary   Musculoskeletal negative musculoskeletal ROS (+)   Abdominal   Peds negative pediatric ROS (+)  Hematology negative hematology ROS (+)   Anesthesia Other Findings   Reproductive/Obstetrics negative OB ROS                             Anesthesia Physical Anesthesia Plan  ASA: III  Anesthesia Plan: General   Post-op Pain Management:    Induction: Intravenous  Airway Management Planned: LMA  Additional Equipment:   Intra-op Plan:   Post-operative Plan: Extubation in OR  Informed Consent: I have reviewed the patients History and Physical, chart, labs and discussed the procedure including the risks, benefits and alternatives for the proposed anesthesia with the patient or authorized representative who has indicated his/her understanding and acceptance.   Dental advisory given  Plan Discussed with: CRNA and Surgeon  Anesthesia Plan Comments:         Anesthesia Quick Evaluation

## 2014-11-11 NOTE — Op Note (Signed)
BUNNIE LEDERMAN is a 74 y.o.   11/11/2014  General  Preop diagnosis: Adenocarcinoma of prostate stage TIc  Postop diagnosis: Same  Procedure done: Gold seeds implantation  Surgeon: Charlene Brooke. Aariyana Manz  Anesthesia: GEN  Indication: Patient is a 74 years old male who has prostate cancer stage TIc. He has Gleason 3+4 and 4+3. PSA is 5.6. Treatment options were discussed with him. He saw Dr. Tammi Klippel in consultation. He said elected to have radiation therapy. He is scheduled today for Gold seeds implantation in preparation for IMRT  Procedure: The patient was identified by his wrist band and proper timeout was taken.  Under general anesthesia patient was prepped and draped and placed in the left lateral decubitus position. The transducer was inserted in the rectum. Three gold seeds were implanted in the prostate. One at the right base, one at the right apex and one at the left mid gland. There was no evidence of bleeding. The transducer was then removed.  The patient tolerated the procedure well and left the OR in satisfactory condition to postanesthesia care unit.

## 2014-11-11 NOTE — Anesthesia Procedure Notes (Signed)
Procedure Name: LMA Insertion Date/Time: 11/11/2014 8:57 AM Performed by: Denna Haggard D Pre-anesthesia Checklist: Patient identified, Emergency Drugs available, Suction available and Patient being monitored Patient Re-evaluated:Patient Re-evaluated prior to inductionOxygen Delivery Method: Circle System Utilized Preoxygenation: Pre-oxygenation with 100% oxygen Intubation Type: IV induction Ventilation: Mask ventilation without difficulty LMA: LMA inserted LMA Size: 4.0 Number of attempts: 1 Airway Equipment and Method: Bite block Placement Confirmation: positive ETCO2 Tube secured with: Tape Dental Injury: Teeth and Oropharynx as per pre-operative assessment

## 2014-11-14 ENCOUNTER — Encounter (HOSPITAL_BASED_OUTPATIENT_CLINIC_OR_DEPARTMENT_OTHER): Payer: Self-pay | Admitting: Urology

## 2014-12-02 ENCOUNTER — Ambulatory Visit
Admission: RE | Admit: 2014-12-02 | Discharge: 2014-12-02 | Disposition: A | Discharge status: Home / Self Care | Payer: Medicare Other | Source: Ambulatory Visit | Attending: Radiation Oncology | Admitting: Radiation Oncology

## 2014-12-02 DIAGNOSIS — C61 Malignant neoplasm of prostate: Secondary | ICD-10-CM | POA: Diagnosis not present

## 2014-12-02 NOTE — Progress Notes (Signed)
  Radiation Oncology         [phone] ________________________________  Name: ACHERON SUGG MRN: [id number]  Date: 12/02/2014  DOB: 05-14-1940  SIMULATION AND TREATMENT PLANNING NOTE    ICD-9-CM ICD-10-CM   1. Malignant neoplasm of prostate 185 C61    DIAGNOSIS: Randeep Biondolillo is a 74 year old gentleman with stage T2a adenocarcinoma of the prostate with a Gleason's score of 4+3 and a PSA of 5.6.  NARRATIVE:  The patient was brought to the [place].  Identity was confirmed.  All relevant records and images related to the planned course of therapy were reviewed.  The patient freely provided informed written consent to proceed with treatment after reviewing the details related to the planned course of therapy. The consent form was witnessed and verified by the simulation staff.  Then, the patient was set-up in a stable reproducible  supine position for radiation therapy.  CT images were obtained.  Surface markings were placed.  The CT images were loaded into the planning software.  Then the target and avoidance structures were contoured.  Treatment planning then occurred.  The radiation prescription was entered and confirmed.  Then, I designed and supervised the construction of a total of one medically necessary complex treatment devices.  I have requested : Intensity Modulated Radiotherapy (IMRT) is medically necessary for this case for the following reason:  Rectal sparing.    PLAN:  The patient will receive 78 Gy in 40 fractions.  This document serves as a record of services personally performed by Tyler Pita, MD. It was created on his behalf by Arlyce Harman, a trained medical scribe. The creation of this record is based on the scribe's personal observations and the provider's statements to them. This document has been checked and approved by the attending provider.    ________________________________  Sheral Apley. Tammi Klippel, M.D.

## 2014-12-06 DIAGNOSIS — C61 Malignant neoplasm of prostate: Secondary | ICD-10-CM | POA: Diagnosis not present

## 2014-12-07 DIAGNOSIS — C61 Malignant neoplasm of prostate: Secondary | ICD-10-CM | POA: Diagnosis not present

## 2014-12-08 DIAGNOSIS — C61 Malignant neoplasm of prostate: Secondary | ICD-10-CM | POA: Diagnosis not present

## 2014-12-13 ENCOUNTER — Ambulatory Visit
Admission: RE | Admit: 2014-12-13 | Discharge: 2014-12-13 | Disposition: A | Discharge status: Home / Self Care | Payer: Medicare Other | Source: Ambulatory Visit | Attending: Radiation Oncology | Admitting: Radiation Oncology

## 2014-12-14 ENCOUNTER — Ambulatory Visit
Admission: RE | Admit: 2014-12-14 | Discharge: 2014-12-14 | Disposition: A | Discharge status: Home / Self Care | Payer: Medicare Other | Source: Ambulatory Visit | Attending: Radiation Oncology | Admitting: Radiation Oncology

## 2014-12-14 ENCOUNTER — Telehealth: Payer: Self-pay | Admitting: Radiation Oncology

## 2014-12-14 NOTE — Telephone Encounter (Signed)
Understand from [place], RT on L1 that there is confusion reference patient's transportation. Patient was able to make it for today's treatment appointment. Phoned patient at home. Patient reports his spouse Lorriane Shire is handling his transportation. Asked him if he was aware of what the issue was and who the transportation company is but, he didn't know. Phoned L1 for clarification. Raquel Sarna, RT is unaware of issue or company. Phoned Vanessa's cell and home number. No answer at either and no option to leave message. Phoned patient back and asked him to have Dominica phone this nurse and provided my contact information.

## 2014-12-15 ENCOUNTER — Ambulatory Visit
Admission: RE | Admit: 2014-12-15 | Discharge: 2014-12-15 | Disposition: A | Discharge status: Home / Self Care | Payer: Medicare Other | Source: Ambulatory Visit | Attending: Radiation Oncology | Admitting: Radiation Oncology

## 2014-12-16 ENCOUNTER — Encounter: Payer: Self-pay | Admitting: Radiation Oncology

## 2014-12-16 ENCOUNTER — Ambulatory Visit
Admission: RE | Admit: 2014-12-16 | Discharge: 2014-12-16 | Disposition: A | Discharge status: Home / Self Care | Payer: Medicare Other | Source: Ambulatory Visit | Attending: Radiation Oncology | Admitting: Radiation Oncology

## 2014-12-16 VITALS — BP 144/80 | HR 61 | Resp 16 | Wt 166.7 lb

## 2014-12-16 DIAGNOSIS — Z510 Encounter for antineoplastic radiation therapy: Secondary | ICD-10-CM | POA: Insufficient documentation

## 2014-12-16 DIAGNOSIS — C61 Malignant neoplasm of prostate: Secondary | ICD-10-CM

## 2014-12-16 NOTE — Progress Notes (Addendum)
Weight and vitals stable. Denies pain. Reports mild dysuria. Reports nocturia x 2. Denies hematuria. Denies urinary leakage or incontinence. Denies diarrhea. Denies fatigue. Oriented patient to staff and routine of the clinic. Provided patient with RADIATION THERAPY AND YOU handbook then, reviewed pertinent information. Educated patient reference potential side effects and management such as fatigue, diarrhea, skin changes, and urinary/bladder changes. Provided patient with my business card and encouraged him to call with needs. Answered all questions to the best of my ability. Patient verbalized understanding of all reviewed.   BP 144/80 mmHg  Pulse 61  Resp 16  Wt 166 lb 11.2 oz (75.615 kg) Wt Readings from Last 3 Encounters:  12/16/14 166 lb 11.2 oz (75.615 kg)  11/11/14 166 lb (75.297 kg)  09/14/14 166 lb 14.4 oz (75.705 kg)

## 2014-12-16 NOTE — Progress Notes (Signed)
   Department of Radiation Oncology  Phone:  [phone] Fax:        [phone]  Weekly Treatment Note    Name: Derek Tanner Date: 12/16/2014 MRN: [id number] DOB: Sep 10, 1940   Current dose: 7.8 Gy  Current fraction: 4   MEDICATIONS: Current Outpatient Prescriptions  Medication Sig Dispense Refill  . albuterol (PROVENTIL) (2.5 MG/3ML) 0.083% nebulizer solution Take 2.5 mg by nebulization as needed for wheezing or shortness of breath.    . fluticasone (FLONASE) 50 MCG/ACT nasal spray Place 1 spray into both nostrils as needed for allergies or rhinitis.    Marland Kitchen lisinopril (PRINIVIL,ZESTRIL) 10 MG tablet Take 10 mg by mouth daily.  1  . meloxicam (MOBIC) 7.5 MG tablet Take 7.5-15 mg by mouth.    . metFORMIN (GLUCOPHAGE) 500 MG tablet Take 500 mg by mouth 2 (two) times daily with a meal.    . terazosin (HYTRIN) 5 MG capsule Take 5 mg by mouth at bedtime.   1  . triamcinolone cream (KENALOG) 0.1 % Apply 1 application topically as needed.     . VENTOLIN HFA 108 (90 BASE) MCG/ACT inhaler Inhale 1 puff into the lungs every 4 (four) hours as needed.   2  . HYDROcodone-acetaminophen (NORCO) 7.5-325 MG per tablet Take 1 tablet by mouth every 6 (six) hours as needed.  0   No current facility-administered medications for this encounter.     ALLERGIES: Review of patient's allergies indicates no known allergies.   LABORATORY DATA:  Lab Results  Component Value Date   HGB 12.6* 11/11/2014   HCT 37.0* 11/11/2014   Lab Results  Component Value Date   NA 141 11/11/2014   K 3.9 11/11/2014   No results found for: ALT, AST, GGT, ALKPHOS, BILITOT   NARRATIVE: Derek Tanner was seen today for weekly treatment management. The chart was checked and the patient's films were reviewed.  Weight and vitals stable. Denies pain. Reports mild dysuria. Reports nocturia x 2. Denies hematuria. Denies urinary leakage or incontinence. Denies diarrhea. Denies fatigue.  BP 144/80 mmHg  Pulse 61   Resp 16  Wt 166 lb 11.2 oz (75.615 kg) Wt Readings from Last 3 Encounters:  12/16/14 166 lb 11.2 oz (75.615 kg)  11/11/14 166 lb (75.297 kg)  09/14/14 166 lb 14.4 oz (75.705 kg)     PHYSICAL EXAMINATION: weight is 166 lb 11.2 oz (75.615 kg). His blood pressure is 144/80 and his pulse is 61. His respiration is 16.        ASSESSMENT: The patient is doing satisfactorily with treatment.  PLAN: We will continue with the patient's radiation treatment as planned.

## 2014-12-19 ENCOUNTER — Ambulatory Visit
Admission: RE | Admit: 2014-12-19 | Discharge: 2014-12-19 | Disposition: A | Discharge status: Home / Self Care | Payer: Medicare Other | Source: Ambulatory Visit | Attending: Radiation Oncology | Admitting: Radiation Oncology

## 2014-12-19 DIAGNOSIS — C61 Malignant neoplasm of prostate: Secondary | ICD-10-CM | POA: Diagnosis not present

## 2014-12-20 ENCOUNTER — Ambulatory Visit
Admission: RE | Admit: 2014-12-20 | Discharge: 2014-12-20 | Disposition: A | Discharge status: Home / Self Care | Payer: Medicare Other | Source: Ambulatory Visit | Attending: Radiation Oncology | Admitting: Radiation Oncology

## 2014-12-20 DIAGNOSIS — C61 Malignant neoplasm of prostate: Secondary | ICD-10-CM | POA: Diagnosis not present

## 2014-12-21 ENCOUNTER — Ambulatory Visit
Admission: RE | Admit: 2014-12-21 | Discharge: 2014-12-21 | Disposition: A | Discharge status: Home / Self Care | Payer: Medicare Other | Source: Ambulatory Visit | Attending: Radiation Oncology | Admitting: Radiation Oncology

## 2014-12-21 DIAGNOSIS — C61 Malignant neoplasm of prostate: Secondary | ICD-10-CM | POA: Diagnosis not present

## 2014-12-22 ENCOUNTER — Ambulatory Visit
Admission: RE | Admit: 2014-12-22 | Discharge: 2014-12-22 | Disposition: A | Discharge status: Home / Self Care | Payer: Medicare Other | Source: Ambulatory Visit | Attending: Radiation Oncology | Admitting: Radiation Oncology

## 2014-12-22 DIAGNOSIS — R300 Dysuria: Secondary | ICD-10-CM | POA: Diagnosis not present

## 2014-12-22 DIAGNOSIS — C61 Malignant neoplasm of prostate: Secondary | ICD-10-CM | POA: Insufficient documentation

## 2014-12-22 DIAGNOSIS — Z510 Encounter for antineoplastic radiation therapy: Secondary | ICD-10-CM | POA: Insufficient documentation

## 2014-12-23 ENCOUNTER — Other Ambulatory Visit: Payer: Self-pay | Admitting: *Deleted

## 2014-12-23 ENCOUNTER — Telehealth: Payer: Self-pay | Admitting: Radiation Oncology

## 2014-12-23 ENCOUNTER — Encounter: Payer: Self-pay | Admitting: Radiation Oncology

## 2014-12-23 ENCOUNTER — Ambulatory Visit
Admission: RE | Admit: 2014-12-23 | Discharge: 2014-12-23 | Disposition: A | Discharge status: Home / Self Care | Payer: Medicare Other | Source: Ambulatory Visit | Attending: Radiation Oncology | Admitting: Radiation Oncology

## 2014-12-23 ENCOUNTER — Ambulatory Visit: Admission: RE | Admit: 2014-12-23 | Payer: Medicare Other | Source: Ambulatory Visit

## 2014-12-23 VITALS — BP 160/72 | HR 52 | Resp 16 | Wt 163.0 lb

## 2014-12-23 DIAGNOSIS — Z510 Encounter for antineoplastic radiation therapy: Secondary | ICD-10-CM | POA: Diagnosis not present

## 2014-12-23 DIAGNOSIS — C61 Malignant neoplasm of prostate: Secondary | ICD-10-CM | POA: Diagnosis present

## 2014-12-23 DIAGNOSIS — R300 Dysuria: Secondary | ICD-10-CM

## 2014-12-23 LAB — URINALYSIS, MICROSCOPIC - CHCC
Bilirubin (Urine): NEGATIVE
Blood: NEGATIVE
GLUCOSE UR CHCC: NEGATIVE mg/dL
Ketones: NEGATIVE mg/dL
Leukocyte Esterase: NEGATIVE
NITRITE: NEGATIVE
PROTEIN: NEGATIVE mg/dL
RBC / HPF: NEGATIVE (ref 0–2)
SPECIFIC GRAVITY, URINE: 1.01 (ref 1.003–1.035)
UROBILINOGEN UR: 0.2 mg/dL (ref 0.2–1)
WBC UA: NEGATIVE (ref 0–2)
pH: 6 (ref 4.6–8.0)

## 2014-12-23 MED ORDER — PHENAZOPYRIDINE HCL 200 MG PO TABS: 200.0000 mg | ORAL_TABLET | Freq: Three times a day (TID) | ORAL | Status: DC | PRN

## 2014-12-23 NOTE — Telephone Encounter (Signed)
Per Dr. Saud Shears order called in pyridium 200 mg tid to [facility] on [address]. Phoned patient making him aware script was called in. Also, educated patient reference use of pyridium and potential for change in the color of his urine. Patient verbalized understanding of all reviewed and expressed appreciation for the call.

## 2014-12-23 NOTE — Progress Notes (Signed)
Weight and vitals stable. Denies pain. Reports nocturia x 3. Reports new onset dysuria. Denies hematuria. Denies incontinence or leakage. Reports diarrhea. Denies fatigue.   BP 160/72 mmHg  Pulse 52  Resp 16  Wt 163 lb (73.936 kg) Wt Readings from Last 3 Encounters:  12/23/14 163 lb (73.936 kg)  12/16/14 166 lb 11.2 oz (75.615 kg)  11/11/14 166 lb (75.297 kg)

## 2014-12-23 NOTE — Progress Notes (Signed)
Quick Note:  Please call patient with normal result.  Thanks. MM ______ 

## 2014-12-23 NOTE — Telephone Encounter (Signed)
Phoned all number listed for patient and wife to inquire about preferred pharmacy. No answer at any number and no option to leave a message. Will continue to attempt to reach the patient.

## 2014-12-23 NOTE — Progress Notes (Signed)
  Radiation Oncology         [phone]   Name: Derek Tanner MRN: [id number]   Date: 12/23/2014  DOB: Jun 04, 1940      Weekly Radiation Therapy Management    ICD-9-CM ICD-10-CM   1. Malignant neoplasm of prostate 185 C61     Current Dose: 17.55 Gy  Planned Dose:  78 Gy  Narrative The patient presents for routine under treatment assessment. Weight and vitals stable. Denies pain. Reports nocturia x 3. Reports new onset dysuria. Reports cloudy urine. Reports frequency. Denies hematuria. Denies incontinence or leakage. Reports diarrhea. Denies fatigue. The patient is without complaint. Set-up films were reviewed. The chart was checked.  Physical Findings  weight is 163 lb (73.936 kg). His blood pressure is 160/72 and his pulse is 52. His respiration is 16. . Weight essentially stable.  No significant changes.  Impression The patient is tolerating radiation.  Plan Continue treatment as planned. I have prescribed pyridium 200 mg to be taken 3x daily. I have ordered a urinalysis for today. Patient prefers to be called on his cell phone with the urinalysis results.    This document serves as a record of services personally performed by Tyler Pita, MD. It was created on his behalf by Arlyce Harman, a trained medical scribe. The creation of this record is based on the scribe's personal observations and the provider's statements to them. This document has been checked and approved by the attending provider.        Sheral Apley Tammi Klippel, M.D.

## 2014-12-23 NOTE — Addendum Note (Signed)
Encounter addended by: Heywood Footman, RN on: 12/23/2014 10:17 AM<BR>     Documentation filed: [employer], Orders

## 2014-12-23 NOTE — Telephone Encounter (Signed)
Per Dr. Mihcael Shears order called patient with normal UA results. Encouraged patient to take pyridium for the dysuria as instructed and call this RN with future needs. Patient verbalized understanding.

## 2014-12-24 LAB — URINE CULTURE

## 2014-12-25 NOTE — Progress Notes (Signed)
Quick Note:  Please call patient with normal result.  Thanks. MM ______ 

## 2014-12-27 ENCOUNTER — Ambulatory Visit
Admission: RE | Admit: 2014-12-27 | Discharge: 2014-12-27 | Disposition: A | Discharge status: Home / Self Care | Payer: Medicare Other | Source: Ambulatory Visit | Attending: Radiation Oncology | Admitting: Radiation Oncology

## 2014-12-27 ENCOUNTER — Telehealth: Payer: Self-pay | Admitting: Radiation Oncology

## 2014-12-27 DIAGNOSIS — Z510 Encounter for antineoplastic radiation therapy: Secondary | ICD-10-CM | POA: Diagnosis not present

## 2014-12-27 NOTE — Telephone Encounter (Signed)
Phoned patient to inquire if pyridium has helped to resolve dysuria. Patient confirms the dysuria resolved with pyridium. Patient reports he has no additional needs at this time.

## 2014-12-28 ENCOUNTER — Ambulatory Visit
Admission: RE | Admit: 2014-12-28 | Discharge: 2014-12-28 | Disposition: A | Discharge status: Home / Self Care | Payer: Medicare Other | Source: Ambulatory Visit | Attending: Radiation Oncology | Admitting: Radiation Oncology

## 2014-12-28 DIAGNOSIS — Z510 Encounter for antineoplastic radiation therapy: Secondary | ICD-10-CM | POA: Diagnosis not present

## 2014-12-29 ENCOUNTER — Ambulatory Visit
Admission: RE | Admit: 2014-12-29 | Discharge: 2014-12-29 | Disposition: A | Discharge status: Home / Self Care | Payer: Medicare Other | Source: Ambulatory Visit | Attending: Radiation Oncology | Admitting: Radiation Oncology

## 2014-12-29 ENCOUNTER — Encounter: Payer: Self-pay | Admitting: Radiation Oncology

## 2014-12-29 VITALS — BP 146/69 | HR 62 | Resp 16 | Wt 164.2 lb

## 2014-12-29 DIAGNOSIS — C61 Malignant neoplasm of prostate: Secondary | ICD-10-CM

## 2014-12-29 DIAGNOSIS — Z510 Encounter for antineoplastic radiation therapy: Secondary | ICD-10-CM | POA: Diagnosis not present

## 2014-12-29 NOTE — Progress Notes (Signed)
Weight and vitals stable. Denies pain. Reports nocturia x 2. Reports mild dysuria but, has only 1 pyridium tablet left. Denies hematuria. Denies incontinence or leakage. Reports diarrhea. Denies fatigue.   BP 146/69 mmHg  Pulse 62  Resp 16  Wt 164 lb 3.2 oz (74.481 kg) Wt Readings from Last 3 Encounters:  12/29/14 164 lb 3.2 oz (74.481 kg)  12/23/14 163 lb (73.936 kg)  12/16/14 166 lb 11.2 oz (75.615 kg)

## 2014-12-29 NOTE — Progress Notes (Signed)
  Radiation Oncology         [phone]   Name: Derek Tanner MRN: [id number]   Date: 12/29/2014  DOB: August 13, 1940      Weekly Radiation Therapy Management    ICD-9-CM ICD-10-CM   1. Malignant neoplasm of prostate 185 C61     Current Dose: 23.4 Gy  Planned Dose:  78 Gy  Narrative The patient presents for routine under treatment assessment. Weight and vitals stable. Denies pain. Reports nocturia x 2. Reports mild dysuria but, has only 1 pyridium tablet left. Denies hematuria. Denies incontinence or leakage. Reports diarrhea. Denies fatigue.  The patient is without complaint. Set-up films were reviewed. The chart was checked.  Physical Findings  weight is 164 lb 3.2 oz (74.481 kg). His blood pressure is 146/69 and his pulse is 62. His respiration is 16. . Weight essentially stable.  No significant changes.  Impression The patient is tolerating radiation.  Plan Continue treatment as planned.     This document serves as a record of services personally performed by Tyler Pita, MD. It was created on his behalf by Arlyce Harman, a trained medical scribe. The creation of this record is based on the scribe's personal observations and the provider's statements to them. This document has been checked and approved by the attending provider.        Sheral Apley Tammi Klippel, M.D.

## 2014-12-30 ENCOUNTER — Ambulatory Visit
Admission: RE | Admit: 2014-12-30 | Discharge: 2014-12-30 | Disposition: A | Discharge status: Home / Self Care | Payer: Medicare Other | Source: Ambulatory Visit | Attending: Radiation Oncology | Admitting: Radiation Oncology

## 2014-12-30 DIAGNOSIS — Z510 Encounter for antineoplastic radiation therapy: Secondary | ICD-10-CM | POA: Diagnosis not present

## 2015-01-02 ENCOUNTER — Ambulatory Visit
Admission: RE | Admit: 2015-01-02 | Discharge: 2015-01-02 | Disposition: A | Discharge status: Home / Self Care | Payer: Medicare Other | Source: Ambulatory Visit | Attending: Radiation Oncology | Admitting: Radiation Oncology

## 2015-01-02 DIAGNOSIS — Z510 Encounter for antineoplastic radiation therapy: Secondary | ICD-10-CM | POA: Diagnosis not present

## 2015-01-03 ENCOUNTER — Ambulatory Visit
Admission: RE | Admit: 2015-01-03 | Discharge: 2015-01-03 | Disposition: A | Discharge status: Home / Self Care | Payer: Medicare Other | Source: Ambulatory Visit | Attending: Radiation Oncology | Admitting: Radiation Oncology

## 2015-01-03 DIAGNOSIS — Z510 Encounter for antineoplastic radiation therapy: Secondary | ICD-10-CM | POA: Diagnosis not present

## 2015-01-04 ENCOUNTER — Ambulatory Visit
Admission: RE | Admit: 2015-01-04 | Discharge: 2015-01-04 | Disposition: A | Discharge status: Home / Self Care | Payer: Medicare Other | Source: Ambulatory Visit | Attending: Radiation Oncology | Admitting: Radiation Oncology

## 2015-01-04 DIAGNOSIS — Z510 Encounter for antineoplastic radiation therapy: Secondary | ICD-10-CM | POA: Diagnosis not present

## 2015-01-05 ENCOUNTER — Ambulatory Visit
Admission: RE | Admit: 2015-01-05 | Discharge: 2015-01-05 | Disposition: A | Discharge status: Home / Self Care | Payer: Medicare Other | Source: Ambulatory Visit | Attending: Radiation Oncology | Admitting: Radiation Oncology

## 2015-01-05 DIAGNOSIS — Z510 Encounter for antineoplastic radiation therapy: Secondary | ICD-10-CM | POA: Diagnosis not present

## 2015-01-06 ENCOUNTER — Ambulatory Visit
Admission: RE | Admit: 2015-01-06 | Discharge: 2015-01-06 | Disposition: A | Discharge status: Home / Self Care | Payer: Medicare Other | Source: Ambulatory Visit | Attending: Radiation Oncology | Admitting: Radiation Oncology

## 2015-01-06 ENCOUNTER — Encounter: Payer: Self-pay | Admitting: Radiation Oncology

## 2015-01-06 VITALS — BP 133/76 | HR 64 | Resp 16 | Wt 165.3 lb

## 2015-01-06 DIAGNOSIS — C61 Malignant neoplasm of prostate: Secondary | ICD-10-CM

## 2015-01-06 DIAGNOSIS — Z510 Encounter for antineoplastic radiation therapy: Secondary | ICD-10-CM | POA: Diagnosis not present

## 2015-01-06 MED ORDER — PHENAZOPYRIDINE HCL 200 MG PO TABS: 200.0000 mg | ORAL_TABLET | Freq: Three times a day (TID) | ORAL | Status: AC | PRN

## 2015-01-06 NOTE — Progress Notes (Signed)
  Radiation Oncology         [phone]   Name: Derek Tanner MRN: [id number]   Date: 01/06/2015  DOB: Jul 08, 1940      Weekly Radiation Therapy Management    ICD-9-CM ICD-10-CM   1. Malignant neoplasm of prostate 185 C61    Malignant neoplasm of prostate  Current Dose: 35.1 Gy  Planned Dose:  78 Gy  Narrative The patient presents for routine under treatment assessment. The patient's weight and vitals are stable. He denies symptoms of pain, he also denies hematuria, incontinence or leakage. Reports diarrhea x 3 on an average day. He denies symptoms fatigue. He reports nocturia x 3. Reports worsening dysuria. Requesting medication to relieve dysuria. Reports finances are tight thus, requesting a cheap alternative.The patient stated that he was concerned about "pain when I go to the bathroom" and that this pain is "all the way through." Set-up films were reviewed. The chart was checked. The patient is excited about his birthday next week. The patient projected a health mental status and was not accompanied by his family for today's radiation oncology appointment.   Physical Findings  weight is 165 lb 4.8 oz (74.98 kg). His blood pressure is 133/76 and his pulse is 64. His respiration is 16.  The patient's weight is essentially stable. There is no significant changes to the status of the paients overall health to be noted at this time. He is alert and oriented x3.   Impression Derek Tanner is a 74 year old gentleman presenting to clinic in regards to his malignant neoplasm of prostate. The patient is tolerating radiation. The patient understands that he can access his appointments and medical records via [facility] [place]. The patient understands that he is to take the prescribed medication Pyridum at 200mg  three times a day in order to alleviate his concerning symptom of burning of the penis. He understands the purpose and proper administration of this prescribed medication. The patient understands the  importance of remaining properly hydrated to ensure his best recovery and treatment.  Plan The patient has been advised to continue treatment as planned. The patient has been instructed to obtain his prescribed Pyridum and administer this prescribed medication as instructed.This [facility] located on [address] in [place], [place]. He is aware of his follow-up appointment with radiation oncology to take place next week, as scheduled. All vocalized questions and concerns have been addressed. If the patient develops any further questions or concerns in regards to his treatment and recovery, he has been encouraged to contact Dr. Tammi Klippel, MD.     This document serves as a record of services personally performed by Tyler Pita, MD. It was created on his behalf by Lenn Cal, a trained medical scribe. The creation of this record is based on the scribe's personal observations and the provider's statements to them. This document has been checked and approved by the attending provider.    __________________________________________________________  Sheral Apley. Tammi Klippel, M.D.

## 2015-01-06 NOTE — Progress Notes (Signed)
Weight and vitals stable. Denies pain. Reports nocturia x 3. Reports worsening dysuria. Requesting medication to relieve dysuria. Reports finances are tight thus, requesting a cheap alternative. Denies hematuria. Denies incontinence or leakage. Reports diarrhea x 3 on an average day. Denies fatigue.  BP 133/76 mmHg  Pulse 64  Resp 16  Wt 165 lb 4.8 oz (74.98 kg) Wt Readings from Last 3 Encounters:  01/06/15 165 lb 4.8 oz (74.98 kg)  12/29/14 164 lb 3.2 oz (74.481 kg)  12/23/14 163 lb (73.936 kg)

## 2015-01-09 ENCOUNTER — Ambulatory Visit
Admission: RE | Admit: 2015-01-09 | Discharge: 2015-01-09 | Disposition: A | Discharge status: Home / Self Care | Payer: Medicare Other | Source: Ambulatory Visit | Attending: Radiation Oncology | Admitting: Radiation Oncology

## 2015-01-09 DIAGNOSIS — Z510 Encounter for antineoplastic radiation therapy: Secondary | ICD-10-CM | POA: Diagnosis not present

## 2015-01-10 ENCOUNTER — Ambulatory Visit
Admission: RE | Admit: 2015-01-10 | Discharge: 2015-01-10 | Disposition: A | Discharge status: Home / Self Care | Payer: Medicare Other | Source: Ambulatory Visit | Attending: Radiation Oncology | Admitting: Radiation Oncology

## 2015-01-10 DIAGNOSIS — Z510 Encounter for antineoplastic radiation therapy: Secondary | ICD-10-CM | POA: Diagnosis not present

## 2015-01-11 ENCOUNTER — Telehealth: Payer: Self-pay | Admitting: Radiation Oncology

## 2015-01-11 ENCOUNTER — Ambulatory Visit
Admission: RE | Admit: 2015-01-11 | Discharge: 2015-01-11 | Disposition: A | Discharge status: Home / Self Care | Payer: Medicare Other | Source: Ambulatory Visit | Attending: Radiation Oncology | Admitting: Radiation Oncology

## 2015-01-11 DIAGNOSIS — Z510 Encounter for antineoplastic radiation therapy: Secondary | ICD-10-CM | POA: Diagnosis not present

## 2015-01-11 NOTE — Telephone Encounter (Signed)
Received word the patient was unable to afford his medication. Phoned [facility] to assist. Awaiting return call. Phoned all numbers listed for patient and his wife. No answer at any and no option to leave message. Will continue to attempt to reach patient or his wife and lenise.

## 2015-01-11 NOTE — Telephone Encounter (Signed)
Phoned patient's pharmacy and spoke with Antony Haste, pharmacist. He reports the cost of a 45 day supply of pyridium is $75.

## 2015-01-12 ENCOUNTER — Ambulatory Visit
Admission: RE | Admit: 2015-01-12 | Discharge: 2015-01-12 | Disposition: A | Discharge status: Home / Self Care | Payer: Medicare Other | Source: Ambulatory Visit | Attending: Radiation Oncology | Admitting: Radiation Oncology

## 2015-01-12 DIAGNOSIS — Z510 Encounter for antineoplastic radiation therapy: Secondary | ICD-10-CM | POA: Diagnosis not present

## 2015-01-13 ENCOUNTER — Ambulatory Visit
Admission: RE | Admit: 2015-01-13 | Discharge: 2015-01-13 | Disposition: A | Discharge status: Home / Self Care | Payer: Medicare Other | Source: Ambulatory Visit | Attending: Radiation Oncology | Admitting: Radiation Oncology

## 2015-01-13 ENCOUNTER — Encounter: Payer: Self-pay | Admitting: Radiation Oncology

## 2015-01-13 VITALS — BP 137/76 | HR 62 | Resp 16 | Wt 166.9 lb

## 2015-01-13 DIAGNOSIS — Z510 Encounter for antineoplastic radiation therapy: Secondary | ICD-10-CM | POA: Diagnosis not present

## 2015-01-13 DIAGNOSIS — C61 Malignant neoplasm of prostate: Secondary | ICD-10-CM

## 2015-01-13 NOTE — Progress Notes (Signed)
  Radiation Oncology         [phone]   Name: Derek Tanner MRN: [id number]   Date: 01/13/2015  DOB: [birth year]      Weekly Radiation Therapy Management    ICD-9-CM ICD-10-CM   1. 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  23/40 treatments completed. The patient's weight and vitals are stable. Denies pain, diarrhea, or hematuria. Reports urinary incontinence, urinary frequency, and dysuria. Since the patient is unable to afford Pyridium, I encouraged the patient to purchase OTC AZO.  The chart was checked.   Physical Findings  weight is 166 lb 14.4 oz (75.705 kg). His blood pressure is 137/76 and his pulse is 62. His respiration is 16 and oxygen saturation is 100%.  The patient's weight is essentially stable. There is no significant changes to the status of the paients overall health to be noted at this time. He is alert and oriented x3.   Impression Derek Tanner is a 74 year old gentleman presenting to clinic in regards to his malignant neoplasm of prostate. The patient is tolerating radiation.    Plan The patient has been advised to continue treatment as planned.  All vocalized questions and concerns have been addressed.    This document serves as a record of services personally performed by Tyler Pita, MD. It was created on his behalf by Darcus Austin, a trained medical scribe. The creation of this record is based on the scribe's personal observations and the provider's statements to them. This document has been checked and approved by the attending provider.     __________________________________________________________  Sheral Apley. Tammi Klippel, M.D.

## 2015-01-13 NOTE — Progress Notes (Signed)
Weight and vitals stable. Denies pain. Anxious to celebrate his birthday today. Reports dysuria. Unable to afford pyridium thus, encouraged him to pick up OTC AZO. Denies diarrhea. Reports urinary incontinence. Reports urinary frequency. Denies hematuria.   BP 137/76 mmHg  Pulse 62  Resp 16  Wt 166 lb 14.4 oz (75.705 kg)  SpO2 100% Wt Readings from Last 3 Encounters:  01/13/15 166 lb 14.4 oz (75.705 kg)  01/06/15 165 lb 4.8 oz (74.98 kg)  12/29/14 164 lb 3.2 oz (74.481 kg)

## 2015-01-16 ENCOUNTER — Ambulatory Visit
Admission: RE | Admit: 2015-01-16 | Discharge: 2015-01-16 | Disposition: A | Discharge status: Home / Self Care | Payer: Medicare Other | Source: Ambulatory Visit | Attending: Radiation Oncology | Admitting: Radiation Oncology

## 2015-01-16 DIAGNOSIS — Z510 Encounter for antineoplastic radiation therapy: Secondary | ICD-10-CM | POA: Diagnosis not present

## 2015-01-17 ENCOUNTER — Ambulatory Visit
Admission: RE | Admit: 2015-01-17 | Discharge: 2015-01-17 | Disposition: A | Discharge status: Home / Self Care | Payer: Medicare Other | Source: Ambulatory Visit | Attending: Radiation Oncology | Admitting: Radiation Oncology

## 2015-01-17 DIAGNOSIS — Z510 Encounter for antineoplastic radiation therapy: Secondary | ICD-10-CM | POA: Diagnosis not present

## 2015-01-18 ENCOUNTER — Ambulatory Visit
Admission: RE | Admit: 2015-01-18 | Discharge: 2015-01-18 | Disposition: A | Discharge status: Home / Self Care | Payer: Medicare Other | Source: Ambulatory Visit | Attending: Radiation Oncology | Admitting: Radiation Oncology

## 2015-01-18 DIAGNOSIS — Z510 Encounter for antineoplastic radiation therapy: Secondary | ICD-10-CM | POA: Diagnosis not present

## 2015-01-19 ENCOUNTER — Ambulatory Visit
Admission: RE | Admit: 2015-01-19 | Discharge: 2015-01-19 | Disposition: A | Discharge status: Home / Self Care | Payer: Medicare Other | Source: Ambulatory Visit | Attending: Radiation Oncology | Admitting: Radiation Oncology

## 2015-01-19 DIAGNOSIS — Z510 Encounter for antineoplastic radiation therapy: Secondary | ICD-10-CM | POA: Diagnosis not present

## 2015-01-20 ENCOUNTER — Encounter: Payer: Self-pay | Admitting: Radiation Oncology

## 2015-01-20 ENCOUNTER — Ambulatory Visit
Admission: RE | Admit: 2015-01-20 | Discharge: 2015-01-20 | Disposition: A | Discharge status: Home / Self Care | Payer: Medicare Other | Source: Ambulatory Visit | Attending: Radiation Oncology | Admitting: Radiation Oncology

## 2015-01-20 VITALS — BP 158/79 | HR 62 | Resp 16 | Wt 168.1 lb

## 2015-01-20 DIAGNOSIS — Z510 Encounter for antineoplastic radiation therapy: Secondary | ICD-10-CM | POA: Diagnosis not present

## 2015-01-20 DIAGNOSIS — C61 Malignant neoplasm of prostate: Secondary | ICD-10-CM

## 2015-01-20 NOTE — Progress Notes (Signed)
  Radiation Oncology         [phone]   Name: Derek Tanner MRN: [id number]   Date: 01/20/2015  DOB: [birth year]      Weekly Radiation Therapy Management    ICD-9-CM ICD-10-CM   1. Malignant neoplasm of prostate ([place]) 185 C61    Malignant neoplasm of prostate  Current Dose: 54.6 Gy  Planned Dose:  78 Gy  Narrative The patient presents for routine under treatment assessment. He has completed 28 fractions.   He denies symptoms of pain, diarrhea, fatigue, or hematuria. The patient reports dysuria continues but, is less intense with AZO and occasional urinary incontinence continues along with frequency. He also reports symptoms of nocturia x 3. The chart was checked. Films were reviewed. The patient projected a healthy mental status and was accompanied by family for today's radiation oncology appointment.    Physical Findings  weight is 168 lb 1.6 oz (76.25 kg). His blood pressure is 158/79 and his pulse is 62. His respiration is 16.  The patient's weight is essentially stable. There is no significant changes to the status of overall health to be noted at this time. He is alert and oriented x3.   Impression Derek Tanner is a 74 year old gentleman presenting to clinic in regards to his malignant neoplasm of prostate. The patient is tolerating radiation.   He is managing current reported symptoms appropriately. The patient understands that he can access his appointments and medical records via [facility].   Plan The patient has been advised to continue treatment as planned. Healthy methods of management in regards to reported symptoms were reviewed in detail. He is advised of his follow-up appointment with radiation oncology to take place next Friday, as scheduled.   All vocalized questions and concerns have been addressed. If the patient develops any further questions or concerns in regards to his treatment and recovery, he has been encouraged to contact Dr. Tammi Klippel, MD.     This  document serves as a record of services personally performed by Tyler Pita, MD. It was created on his behalf by Lenn Cal, a trained medical scribe. The creation of this record is based on the scribe's personal observations and the provider's statements to them. This document has been checked and approved by the attending provider.  __________________________________________________________  Sheral Apley. Tammi Klippel, M.D.

## 2015-01-20 NOTE — Progress Notes (Signed)
Weight and vitals stable. Denies pain. Reports dysuria continues but, is less intense with AZO. Denies hematuria. Reports occasional urinary incontinence continues along with frequency. Reports nocturia x 3. Denies diarrhea. Denies fatigue.  BP 158/79 mmHg  Pulse 62  Resp 16  Wt 168 lb 1.6 oz (76.25 kg) Wt Readings from Last 3 Encounters:  01/20/15 168 lb 1.6 oz (76.25 kg)  01/13/15 166 lb 14.4 oz (75.705 kg)  01/06/15 165 lb 4.8 oz (74.98 kg)

## 2015-01-23 ENCOUNTER — Ambulatory Visit
Admission: RE | Admit: 2015-01-23 | Discharge: 2015-01-23 | Disposition: A | Discharge status: Home / Self Care | Payer: Medicare Other | Source: Ambulatory Visit | Attending: Radiation Oncology | Admitting: Radiation Oncology

## 2015-01-23 DIAGNOSIS — R351 Nocturia: Secondary | ICD-10-CM | POA: Diagnosis not present

## 2015-01-23 DIAGNOSIS — C61 Malignant neoplasm of prostate: Secondary | ICD-10-CM | POA: Insufficient documentation

## 2015-01-23 DIAGNOSIS — Z510 Encounter for antineoplastic radiation therapy: Secondary | ICD-10-CM | POA: Insufficient documentation

## 2015-01-25 ENCOUNTER — Ambulatory Visit
Admission: RE | Admit: 2015-01-25 | Discharge: 2015-01-25 | Disposition: A | Discharge status: Home / Self Care | Payer: Medicare Other | Source: Ambulatory Visit | Attending: Radiation Oncology | Admitting: Radiation Oncology

## 2015-01-25 DIAGNOSIS — Z510 Encounter for antineoplastic radiation therapy: Secondary | ICD-10-CM | POA: Diagnosis not present

## 2015-01-26 ENCOUNTER — Encounter: Payer: Self-pay | Admitting: Radiation Oncology

## 2015-01-26 ENCOUNTER — Ambulatory Visit
Admission: RE | Admit: 2015-01-26 | Discharge: 2015-01-26 | Disposition: A | Discharge status: Home / Self Care | Payer: Medicare Other | Source: Ambulatory Visit | Attending: Radiation Oncology | Admitting: Radiation Oncology

## 2015-01-26 VITALS — BP 147/75 | HR 70 | Resp 16 | Wt 165.8 lb

## 2015-01-26 DIAGNOSIS — Z510 Encounter for antineoplastic radiation therapy: Secondary | ICD-10-CM | POA: Diagnosis not present

## 2015-01-26 DIAGNOSIS — C61 Malignant neoplasm of prostate: Secondary | ICD-10-CM

## 2015-01-26 NOTE — Progress Notes (Signed)
  Radiation Oncology         [phone]   Name: Derek Tanner MRN: [id number]   Date: 01/26/2015  DOB: 1940/09/01      Weekly Radiation Therapy Management    ICD-9-CM ICD-10-CM   1. Malignant neoplasm of prostate ([place]) 185 C61    Malignant neoplasm of prostate  Current Dose: 60.45 Gy  Planned Dose:  78 Gy  Narrative The patient presents for routine under treatment assessment. He has completed 31 fractions, with 9 treatments remaining.   He denies symptoms of pain, hematuria, or fatigue. He reports symptom of dysuria continuing despite using AZO. The patient also confirms occasional urinary incontinence and frequency, occasional diarrhea, and nocturia x3. However, the therapist reported (yesterday) that the patient was incontinent of stool while on the treatment table.   The patient projected a healthy mental status and was not accompanied by family for today's radiation oncology appointment.    Physical Findings  weight is 165 lb 12.8 oz (75.206 kg). His blood pressure is 147/75 and his pulse is 70. His respiration is 16 and oxygen saturation is 100%.  The patient's weight is essentially stable. There is no significant changes to the status of overall health to be noted at this time. He is alert and oriented x3.   Impression Derek Tanner is a 74 year old gentleman presenting to clinic in regards to his malignant neoplasm of prostate. The patient is tolerating radiation.   He is managing current reported symptoms appropriately. The patient understands that he can access his appointments and medical records via [place].   Plan The patient has been advised to continue treatment as planned. Healthy methods of management in regards to reported symptoms were reviewed in detail. He is advised of his follow-up appointment with radiation oncology to take place next week, as scheduled.  All vocalized questions and concerns have been addressed. If the patient develops any further questions  or concerns in regards to his treatment and recovery, he has been encouraged to contact Dr. Tammi Klippel, MD.     This document serves as a record of services personally performed by Tyler Pita, MD. It was created on his behalf by Lenn Cal, a trained medical scribe. The creation of this record is based on the scribe's personal observations and the provider's statements to them. This document has been checked and approved by the attending provider.  __________________________________________________________  Sheral Apley. Tammi Klippel, M.D.

## 2015-01-26 NOTE — Progress Notes (Signed)
Weight and vitals stable. Denies pain. Reports dysuria continues despite using AZO. Denies hematuria. Reports occasional urinary incontinence and frequency continues. Reports nocturia x 3. Reports occasional diarrhea. However, therapist reported yesterday patient was incontinent of stool while on the treatment table. Denies fatigue.   BP 147/75 mmHg  Pulse 70  Resp 16  Wt 165 lb 12.8 oz (75.206 kg)  SpO2 100% Wt Readings from Last 3 Encounters:  01/26/15 165 lb 12.8 oz (75.206 kg)  01/20/15 168 lb 1.6 oz (76.25 kg)  01/13/15 166 lb 14.4 oz (75.705 kg)

## 2015-01-27 ENCOUNTER — Encounter: Payer: Self-pay | Admitting: Radiation Oncology

## 2015-01-27 ENCOUNTER — Ambulatory Visit
Admission: RE | Admit: 2015-01-27 | Discharge: 2015-01-27 | Disposition: A | Discharge status: Home / Self Care | Payer: Medicare Other | Source: Ambulatory Visit | Attending: Radiation Oncology | Admitting: Radiation Oncology

## 2015-01-27 ENCOUNTER — Ambulatory Visit: Payer: Medicare Other

## 2015-01-30 ENCOUNTER — Inpatient Hospital Stay: Admission: RE | Admit: 2015-01-30 | Payer: Self-pay | Source: Ambulatory Visit | Admitting: Radiation Oncology

## 2015-01-30 ENCOUNTER — Telehealth: Payer: Self-pay | Admitting: Radiation Oncology

## 2015-01-30 ENCOUNTER — Encounter: Payer: Self-pay | Admitting: Radiation Oncology

## 2015-01-30 ENCOUNTER — Ambulatory Visit
Admission: RE | Admit: 2015-01-30 | Discharge: 2015-01-30 | Disposition: A | Discharge status: Home / Self Care | Payer: Medicare Other | Source: Ambulatory Visit | Attending: Radiation Oncology | Admitting: Radiation Oncology

## 2015-01-30 DIAGNOSIS — C61 Malignant neoplasm of prostate: Secondary | ICD-10-CM

## 2015-01-30 DIAGNOSIS — Z510 Encounter for antineoplastic radiation therapy: Secondary | ICD-10-CM | POA: Diagnosis not present

## 2015-01-30 NOTE — Progress Notes (Signed)
Per Dr. Charlton Amor order the patient's CT cone beam showed a bladder stone. Phoned [facility] and spoke with Roselyn Reef. Patient to be seen today by NP at [facility]. Patient provided medication assistance form by [employer]. Patient understands he can present the form at [facility] to receive assistance with his medications. Escorted patient from [facility] rad onc to [facility].

## 2015-01-30 NOTE — Progress Notes (Signed)
Weight and vitals stable. Reports pain 10 on a scale of 0-10 when he urinates. Reports when he voids he is such pain he cries and shakes. Reports AZO and pyridium have not relieve his pain at all. Denies hematuria. Reports he voids frequently during the day and night time hours only a small amount each time. Denies diarrhea or incontinence. Reports great urgency.   BP 137/63 mmHg  Pulse 75  Temp(Src) 98.6 F (37 C) (Oral)  Resp 16  Wt 166 lb 11.2 oz (75.615 kg)  SpO2 100% Wt Readings from Last 3 Encounters:  01/30/15 166 lb 11.2 oz (75.615 kg)  01/26/15 165 lb 12.8 oz (75.206 kg)  01/20/15 168 lb 1.6 oz (76.25 kg)

## 2015-01-30 NOTE — Telephone Encounter (Signed)
Patient's wife phoned. She reports the patient is experiencing an extreme burning pain when he voids. She states, "he cries and shakes in pain when he urinates." She confirms they tried AZO and pyridium without relief. Asked Vicente Males, RT on L1 to send patient around today to be assessed by oncall rad onc physician.

## 2015-01-30 NOTE — Progress Notes (Signed)
CC: Dr. Tyler Pita  Weekly Management Note:  Site: Prostate Current Dose:   6240  cGy Projected Dose:  7800  cGy  Narrative: The patient is seen today for routine under treatment assessment. CBCT/MVCT images/port films were reviewed. The chart was reviewed.    His bladder filling is satisfactory and he does not appear to be in urinary retention. There is a bladder stone.  He continues to have worsening dysuria which is 10/10.  He  Is also having worsening urgency.  He is on terazosin at bedtime.  He is not sure if this is for hypertension or obstructive urinary symptoms. He is been taking Azo/ Pyridium without much benefit. He tells me his force of stream is diminished. No fever or chills.  Physical Examination: There were no vitals filed for this visit..  Weight:  .  Rectal examination not performed today.  Impression:  He is having a difficult time with dysuria an obstructive symptomatology. Based on his cone beam CT scan, he does not appear to be in urinary retention.  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 I wonder if his bladder stone,  seen on his cone beam CT, is  contributing to his symptomatology.  Will have him seen at [facility] for further evaluation.  Urinalysis is not obtained today since he will probably have one at [facility].  Plan: Continue radiation therapy as planned.

## 2015-01-30 NOTE — Progress Notes (Signed)
See dictated note from earlier today. 

## 2015-01-30 NOTE — Telephone Encounter (Signed)
After conversation this morning with wife this RN attempted to call her with an update. No answer on either phone and no option to leave a message.

## 2015-01-31 ENCOUNTER — Ambulatory Visit
Admission: RE | Admit: 2015-01-31 | Discharge: 2015-01-31 | Disposition: A | Discharge status: Home / Self Care | Payer: Medicare Other | Source: Ambulatory Visit | Attending: Radiation Oncology | Admitting: Radiation Oncology

## 2015-01-31 DIAGNOSIS — Z510 Encounter for antineoplastic radiation therapy: Secondary | ICD-10-CM | POA: Diagnosis not present

## 2015-02-01 ENCOUNTER — Ambulatory Visit
Admission: RE | Admit: 2015-02-01 | Discharge: 2015-02-01 | Disposition: A | Discharge status: Home / Self Care | Payer: Medicare Other | Source: Ambulatory Visit | Attending: Radiation Oncology | Admitting: Radiation Oncology

## 2015-02-01 DIAGNOSIS — Z510 Encounter for antineoplastic radiation therapy: Secondary | ICD-10-CM | POA: Diagnosis not present

## 2015-02-02 ENCOUNTER — Ambulatory Visit
Admission: RE | Admit: 2015-02-02 | Discharge: 2015-02-02 | Disposition: A | Discharge status: Home / Self Care | Payer: Medicare Other | Source: Ambulatory Visit | Attending: Radiation Oncology | Admitting: Radiation Oncology

## 2015-02-02 ENCOUNTER — Encounter: Payer: Self-pay | Admitting: Radiation Oncology

## 2015-02-02 VITALS — BP 136/82 | HR 82 | Resp 16 | Wt 167.2 lb

## 2015-02-02 DIAGNOSIS — Z510 Encounter for antineoplastic radiation therapy: Secondary | ICD-10-CM | POA: Diagnosis not present

## 2015-02-02 DIAGNOSIS — C61 Malignant neoplasm of prostate: Secondary | ICD-10-CM

## 2015-02-02 NOTE — Progress Notes (Signed)
Weight and vitals stable. Reports pain 10 on a scale of 0-10 when he urinates. Reports when he voids he cries and shakes in pain. Bladder stone found on imaging on 10/10 and patient was sent over for an evaluation at [facility]. NP at [facility] prescribed medication (patient unsure of the name) but, patient denies any relief of his symptoms. Requested office notes from [facility]. Reports constipation. Reports urgency. Reports he void frequently during the day and night times hours but, only small amounts.   BP 136/82 mmHg  Pulse 82  Resp 16  Wt 167 lb 3.2 oz (75.841 kg)  SpO2 100% Wt Readings from Last 3 Encounters:  02/02/15 167 lb 3.2 oz (75.841 kg)  01/30/15 166 lb 11.2 oz (75.615 kg)  01/26/15 165 lb 12.8 oz (75.206 kg)

## 2015-02-02 NOTE — Progress Notes (Signed)
  Radiation Oncology         [phone]   Name: Derek Tanner MRN: [id number]   Date: 02/02/2015  DOB: 03/08/1941      Weekly Radiation Therapy Management    ICD-9-CM ICD-10-CM   1. Malignant neoplasm of prostate ([place]) 185 C61    Malignant neoplasm of prostate  Current Dose: 66.5 Gy  Planned Dose:  78 Gy  Narrative The patient presents for routine under treatment assessment.   Weight and vitals stable. Reports pain 10 on a scale of 0-10 when he urinates. Reports when he voids he cries and shakes in pain. Bladder stone found on imaging on 10/10 and patient was sent over for an evaluation at [facility]. NP at [facility] prescribed medication (patient unsure of the name) but, patient denies any relief of his symptoms. Requested office notes from [facility]. Reports constipation. Reports urgency. Reports he void frequently during the day and night times hours but, only small amounts.     Physical Findings  weight is 167 lb 3.2 oz (75.841 kg). His blood pressure is 136/82 and his pulse is 82. His respiration is 16 and oxygen saturation is 100%.  The patient's weight is essentially stable. There is no significant changes to the status of overall health to be noted at this time.    Impression Derek Tanner is a 74 year old gentleman presenting to clinic in regards to his malignant neoplasm of prostate. The patient is tolerating radiation.   He is managing current reported symptoms appropriately.   Plan The patient has been advised to continue treatment as planned. Healthy methods of management in regards to reported symptoms were reviewed in detail.   All vocalized questions and concerns have been addressed. If the patient develops any further questions or concerns in regards to his treatment and recovery, he has been encouraged to contact Dr. Tammi Klippel, MD.     This document serves as a record of services personally performed by Tyler Pita, MD. It was created on his behalf by Arlyce Harman, a trained medical scribe. The creation of this record is based on the scribe's personal observations and the provider's statements to them. This document has been checked and approved by the attending provider.     __________________________________________________________  Sheral Apley. Tammi Klippel, M.D.

## 2015-02-03 ENCOUNTER — Ambulatory Visit
Admission: RE | Admit: 2015-02-03 | Discharge: 2015-02-03 | Disposition: A | Discharge status: Home / Self Care | Payer: Medicare Other | Source: Ambulatory Visit | Attending: Radiation Oncology | Admitting: Radiation Oncology

## 2015-02-03 DIAGNOSIS — Z510 Encounter for antineoplastic radiation therapy: Secondary | ICD-10-CM | POA: Diagnosis not present

## 2015-02-06 ENCOUNTER — Ambulatory Visit
Admission: RE | Admit: 2015-02-06 | Discharge: 2015-02-06 | Disposition: A | Discharge status: Home / Self Care | Payer: Medicare Other | Source: Ambulatory Visit | Attending: Radiation Oncology | Admitting: Radiation Oncology

## 2015-02-06 DIAGNOSIS — Z510 Encounter for antineoplastic radiation therapy: Secondary | ICD-10-CM | POA: Diagnosis not present

## 2015-02-07 ENCOUNTER — Ambulatory Visit: Payer: Medicare Other

## 2015-02-07 ENCOUNTER — Ambulatory Visit
Admission: RE | Admit: 2015-02-07 | Discharge: 2015-02-07 | Disposition: A | Discharge status: Home / Self Care | Payer: Medicare Other | Source: Ambulatory Visit | Attending: Radiation Oncology | Admitting: Radiation Oncology

## 2015-02-07 DIAGNOSIS — Z510 Encounter for antineoplastic radiation therapy: Secondary | ICD-10-CM | POA: Diagnosis not present

## 2015-02-08 ENCOUNTER — Ambulatory Visit
Admission: RE | Admit: 2015-02-08 | Discharge: 2015-02-08 | Disposition: A | Discharge status: Home / Self Care | Payer: Medicare Other | Source: Ambulatory Visit | Attending: Radiation Oncology | Admitting: Radiation Oncology

## 2015-02-08 ENCOUNTER — Encounter (HOSPITAL_COMMUNITY): Payer: Self-pay | Admitting: *Deleted

## 2015-02-08 ENCOUNTER — Emergency Department (HOSPITAL_COMMUNITY): Payer: Medicare Other

## 2015-02-08 ENCOUNTER — Emergency Department (HOSPITAL_COMMUNITY)
Admission: EM | Admit: 2015-02-08 | Discharge: 2015-02-08 | Disposition: A | Discharge status: Home / Self Care | Payer: Medicare Other | Attending: Emergency Medicine | Admitting: Emergency Medicine

## 2015-02-08 ENCOUNTER — Telehealth: Payer: Self-pay | Admitting: Radiation Oncology

## 2015-02-08 ENCOUNTER — Other Ambulatory Visit: Payer: Self-pay

## 2015-02-08 DIAGNOSIS — Z7951 Long term (current) use of inhaled steroids: Secondary | ICD-10-CM | POA: Insufficient documentation

## 2015-02-08 DIAGNOSIS — R079 Chest pain, unspecified: Secondary | ICD-10-CM | POA: Insufficient documentation

## 2015-02-08 DIAGNOSIS — Z87891 Personal history of nicotine dependence: Secondary | ICD-10-CM | POA: Insufficient documentation

## 2015-02-08 DIAGNOSIS — N400 Enlarged prostate without lower urinary tract symptoms: Secondary | ICD-10-CM | POA: Insufficient documentation

## 2015-02-08 DIAGNOSIS — E119 Type 2 diabetes mellitus without complications: Secondary | ICD-10-CM | POA: Diagnosis not present

## 2015-02-08 DIAGNOSIS — I10 Essential (primary) hypertension: Secondary | ICD-10-CM | POA: Insufficient documentation

## 2015-02-08 DIAGNOSIS — Z79899 Other long term (current) drug therapy: Secondary | ICD-10-CM | POA: Insufficient documentation

## 2015-02-08 DIAGNOSIS — Z8546 Personal history of malignant neoplasm of prostate: Secondary | ICD-10-CM | POA: Diagnosis not present

## 2015-02-08 DIAGNOSIS — Z791 Long term (current) use of non-steroidal anti-inflammatories (NSAID): Secondary | ICD-10-CM | POA: Diagnosis not present

## 2015-02-08 DIAGNOSIS — Z8669 Personal history of other diseases of the nervous system and sense organs: Secondary | ICD-10-CM | POA: Diagnosis not present

## 2015-02-08 DIAGNOSIS — J449 Chronic obstructive pulmonary disease, unspecified: Secondary | ICD-10-CM | POA: Insufficient documentation

## 2015-02-08 DIAGNOSIS — Z8659 Personal history of other mental and behavioral disorders: Secondary | ICD-10-CM | POA: Diagnosis not present

## 2015-02-08 LAB — CBC
HCT: 36.5 % — ABNORMAL LOW (ref 39.0–52.0)
Hemoglobin: 12.3 g/dL — ABNORMAL LOW (ref 13.0–17.0)
MCH: 29.3 pg (ref 26.0–34.0)
MCHC: 33.7 g/dL (ref 30.0–36.0)
MCV: 86.9 fL (ref 78.0–100.0)
PLATELETS: 290 10*3/uL (ref 150–400)
RBC: 4.2 MIL/uL — AB (ref 4.22–5.81)
RDW: 14.2 % (ref 11.5–15.5)
WBC: 5.6 10*3/uL (ref 4.0–10.5)

## 2015-02-08 LAB — URINE MICROSCOPIC-ADD ON

## 2015-02-08 LAB — URINALYSIS, ROUTINE W REFLEX MICROSCOPIC
BILIRUBIN URINE: NEGATIVE
Glucose, UA: 100 mg/dL — AB
Ketones, ur: NEGATIVE mg/dL
Nitrite: NEGATIVE
PH: 5.5 (ref 5.0–8.0)
Protein, ur: 30 mg/dL — AB
SPECIFIC GRAVITY, URINE: 1.017 (ref 1.005–1.030)
Urobilinogen, UA: 0.2 mg/dL (ref 0.0–1.0)

## 2015-02-08 LAB — COMPREHENSIVE METABOLIC PANEL
ALBUMIN: 4 g/dL (ref 3.5–5.0)
ALT: 11 U/L — AB (ref 17–63)
AST: 13 U/L — ABNORMAL LOW (ref 15–41)
Alkaline Phosphatase: 53 U/L (ref 38–126)
Anion gap: 4 — ABNORMAL LOW (ref 5–15)
BILIRUBIN TOTAL: 0.5 mg/dL (ref 0.3–1.2)
BUN: 18 mg/dL (ref 6–20)
CHLORIDE: 106 mmol/L (ref 101–111)
CO2: 29 mmol/L (ref 22–32)
CREATININE: 1 mg/dL (ref 0.61–1.24)
Calcium: 9.5 mg/dL (ref 8.9–10.3)
GFR calc non Af Amer: >60 mL/min (ref >60)
Glucose, Bld: 165 mg/dL — ABNORMAL HIGH (ref 65–99)
Potassium: 4 mmol/L (ref 3.5–5.1)
Sodium: 139 mmol/L (ref 135–145)
TOTAL PROTEIN: 6.9 g/dL (ref 6.5–8.1)

## 2015-02-08 LAB — LIPASE, BLOOD: LIPASE: 35 U/L (ref 22–51)

## 2015-02-08 LAB — I-STAT TROPONIN, ED
TROPONIN I, POC: 0.01 ng/mL (ref 0.00–0.08)
Troponin i, poc: 0.01 ng/mL (ref 0.00–0.08)

## 2015-02-08 MED ORDER — ALUM & MAG HYDROXIDE-SIMETH 200-200-20 MG/5ML PO SUSP
15.0000 mL | Freq: Once | ORAL | Status: AC
  Administered 2015-02-08: 15 mL via ORAL
  Filled 2015-02-08: qty 30

## 2015-02-08 MED ORDER — IOHEXOL 350 MG/ML SOLN
100.0000 mL | Freq: Once | INTRAVENOUS | Status: DC | PRN
  Administered 2015-02-08: 100 mL via INTRAVENOUS
  Filled 2015-02-08: qty 100

## 2015-02-08 MED ORDER — SODIUM CHLORIDE 0.9 % IV SOLN
20.0000 mL | INTRAVENOUS | Status: DC
  Administered 2015-02-08: 20 mL via INTRAVENOUS

## 2015-02-08 MED ORDER — LIDOCAINE VISCOUS 2 % MT SOLN
15.0000 mL | Freq: Once | OROMUCOSAL | Status: AC
  Administered 2015-02-08: 15 mL via OROMUCOSAL
  Filled 2015-02-08: qty 15

## 2015-02-08 MED ORDER — NITROGLYCERIN 0.4 MG SL SUBL: 0.4000 mg | SUBLINGUAL_TABLET | SUBLINGUAL | Status: DC | PRN

## 2015-02-08 NOTE — Telephone Encounter (Signed)
Have attempted several times to reach patient's ex wife. Patient requested this RN phone her and inform her he was taken to the ER with chest pain. No answer at either number. No option to leave message.

## 2015-02-08 NOTE — ED Notes (Signed)
Patient is alert and oriented x3.  He was given DC instructions and follow up visit instructions.  Patient gave verbal understanding.  He was DC ambulatory under his own power to home.  V/S stable.  He was not showing any signs of distress on DC 

## 2015-02-08 NOTE — ED Notes (Signed)
EMS called to home.  Found patient  With complaints of mid sternal chest pain that started this morning. Patient denies having this issue before.  Patient currently rates his pain 5 of 10 with no radiation or nausea.  Currently  Patient has 2 chemo treatments left.

## 2015-02-08 NOTE — ED Provider Notes (Signed)
CSN: [id number]     Arrival date & time 02/08/15  1011 History   First MD Initiated Contact with Patient 02/08/15 1037     Chief Complaint  Patient presents with  . Chest Pain     (Consider location/radiation/quality/duration/timing/severity/associated sxs/prior Treatment) Patient is a 74 y.o. male presenting with chest pain. The history is provided by the patient.  Chest Pain Pain location:  Substernal area Pain quality: sharp and shooting   Pain radiates to:  Does not radiate Pain radiates to the back: no   Pain severity:  Moderate Onset quality:  Sudden Duration:  1 day Timing:  Constant Progression:  Unchanged Chronicity:  New Relieved by:  Nothing Worsened by:  Nothing tried Ineffective treatments:  None tried Associated symptoms: no abdominal pain, no fever, no headache, no palpitations, no shortness of breath and not vomiting   Risk factors: diabetes mellitus, high cholesterol and hypertension   Risk factors: no coronary artery disease and no prior DVT/PE     74 yo M with a chief complaint chest pain. This pain is sharp substernal. Has been coming and going today. Patient was waiting for radiation therapy for his prostate cancer and while in the waiting room had an episode of this. This was noticed by the triage nurse and he was brought rapidly down to the ED.  Patient is unsure if this better or worse. Has difficulty actually describing the character. Denies history of prior blood clots. Currently undergoing chemotherapy and radiation for prostate cancer. Denies any other PE risk factors.  Past Medical History  Diagnosis Date  . Type 2 diabetes mellitus ([place])   . Prostate induration     LEFT LOBE  . Anxiety   . History of prolonged Q-T interval on ECG   . Depression   . Hypertension   . COPD (chronic obstructive pulmonary disease) ([place])   . 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 Prostate cancer ([place])   . At risk for sleep apnea     STOP-BANG= 4        SENT TO PCP  11-09-2014   Past Surgical History  Procedure Laterality Date  . Craniotomy for tumor  2002  . Prostate biopsy N/A 08/19/2014    Procedure: BIOPSY TRANSRECTAL ULTRASONIC PROSTATE (TUBP);  Surgeon: Lowella Bandy, MD;  Location: [facility];  Service: Urology;  Laterality: N/A;  Girtha Rm seed implant N/A 11/11/2014    Procedure: GOLD SEED IMPLANT;  Surgeon: Lowella Bandy, MD;  Location: [facility];  Service: Urology;  Laterality: N/A;  [facility] to supply Gold Seeds   Family History  Problem Relation Age of Onset  . Cancer Brother     throat   Social History  Substance Use Topics  . Smoking status: Former Smoker -- 0.50 packs/day for 66 years    Types: Cigarettes    Quit date: 10/26/2014  . Smokeless tobacco: Never Used  . Alcohol Use: No    Review of Systems  Constitutional: Negative for fever and chills.  HENT: Negative for congestion and facial swelling.   Eyes: Negative for discharge and visual disturbance.  Respiratory: Negative for shortness of breath.   Cardiovascular: Positive for chest pain. Negative for palpitations.  Gastrointestinal: Negative for vomiting, abdominal pain and diarrhea.  Musculoskeletal: Negative for myalgias and arthralgias.  Skin: Negative for color change and rash.  Neurological: Negative for tremors, syncope and headaches.  Psychiatric/Behavioral: Negative for confusion and dysphoric mood.      Allergies  Review of patient's  allergies indicates no known allergies.  Home Medications   Prior to Admission medications   Medication Sig Start Date End Date Taking? Authorizing Provider  albuterol (PROVENTIL) (2.5 MG/3ML) 0.083% nebulizer solution Take 2.5 mg by nebulization as needed for wheezing or shortness of breath.   Yes Historical Provider, MD  fluticasone (FLONASE) 50 MCG/ACT nasal spray Place 1 spray into both nostrils as needed for allergies or rhinitis.   Yes Historical Provider, MD  HYDROcodone-acetaminophen (NORCO)  7.5-325 MG per tablet Take 1 tablet by mouth every 6 (six) hours as needed for moderate pain.  09/02/14  Yes Historical Provider, MD  lisinopril (PRINIVIL,ZESTRIL) 20 MG tablet Take 20 mg by mouth daily.   Yes Historical Provider, MD  meloxicam (MOBIC) 7.5 MG tablet Take 7.5-15 mg by mouth. 09/17/13  Yes Historical Provider, MD  terazosin (HYTRIN) 5 MG capsule Take 5 mg by mouth at bedtime.  07/04/14  Yes Historical Provider, MD  triamcinolone cream (KENALOG) 0.1 % Apply 1 application topically daily as needed (rash).  04/28/13  Yes Historical Provider, MD  VENTOLIN HFA 108 (90 BASE) MCG/ACT inhaler Inhale 1 puff into the lungs every 4 (four) hours as needed for wheezing or shortness of breath.  09/02/14  Yes Historical Provider, MD  phenazopyridine (PYRIDIUM) 200 MG tablet Take 1 tablet (200 mg total) by mouth 3 (three) times daily as needed for pain. Patient not taking: Reported on 02/08/2015 01/06/15   Tyler Pita, MD   BP 160/72 mmHg  Pulse 66  Temp(Src) 97.6 F (36.4 C) (Oral)  Resp 20  Ht 5\' 7"  (1.702 m)  Wt 168 lb (76.204 kg)  BMI 26.31 kg/m2  SpO2 100% Physical Exam  Constitutional: He is oriented to person, place, and time. He appears well-developed and well-nourished.  HENT:  Head: Normocephalic and atraumatic.  Eyes: EOM are normal. Pupils are equal, round, and reactive to light.  Neck: Normal range of motion. Neck supple. No JVD present.  Cardiovascular: Normal rate and regular rhythm.  Exam reveals no gallop and no friction rub.   No murmur heard. Pulmonary/Chest: No respiratory distress. He has no wheezes.  Abdominal: He exhibits no distension. There is no rebound and no guarding.  Musculoskeletal: Normal range of motion. He exhibits no edema or tenderness.  Neurological: He is alert and oriented to person, place, and time.  Skin: No rash noted. No pallor.  Psychiatric: He has a normal mood and affect. His behavior is normal.  Nursing note and vitals reviewed.   ED  Course  Procedures (including critical care time) Labs Review Labs Reviewed  CBC - Abnormal; Notable for the following:    RBC 4.20 (*)    Hemoglobin 12.3 (*)    HCT 36.5 (*)    All other components within normal limits  URINALYSIS, ROUTINE W REFLEX MICROSCOPIC (NOT AT [facility]) - Abnormal; Notable for the following:    APPearance CLOUDY (*)    Glucose, UA 100 (*)    Hgb urine dipstick MODERATE (*)    Protein, ur 30 (*)    Leukocytes, UA SMALL (*)    All other components within normal limits  COMPREHENSIVE METABOLIC PANEL - Abnormal; Notable for the following:    Glucose, Bld 165 (*)    AST 13 (*)    ALT 11 (*)    Anion gap 4 (*)    All other components within normal limits  URINE MICROSCOPIC-ADD ON  LIPASE, BLOOD  I-STAT TROPOININ, ED  I-STAT TROPOININ, ED    Imaging Review  Ct Angio Chest Pe W/cm &/or Wo Cm  02/08/2015  CLINICAL DATA:  Chest pain. EXAM: CT ANGIOGRAPHY CHEST WITH CONTRAST TECHNIQUE: Multidetector CT imaging of the chest was performed using the standard protocol during bolus administration of intravenous contrast. Multiplanar CT image reconstructions and MIPs were obtained to evaluate the vascular anatomy. CONTRAST:  126mL OMNIPAQUE IOHEXOL 350 MG/ML SOLN COMPARISON:  None. FINDINGS: THORACIC INLET/BODY WALL: No acute abnormality. MEDIASTINUM: Normal heart size. No pericardial effusion. No convincing pulmonary embolism. No acute aortic findings. Mild prominence of hilar lymph nodes with subtle calcifications, likely post granulomatous . LUNG WINDOWS: Few scattered pulmonary nodules, on the left on series 7, image 32, 41, and 45. On the right on image 50 and 52, partly obscured by ground-glass attenuation from atelectasis. Nodules measure up to 4 mm in diameter. There is no edema, consolidation, effusion, or pneumothorax. UPPER ABDOMEN: No acute findings. OSSEOUS: No acute fracture.  No suspicious lytic or blastic lesions. Review of the MIP images confirms the above  findings. IMPRESSION: 1. Negative for pulmonary embolism or other acute finding. 2. Few small pulmonary nodules up to 4 mm, usually benign. Given risk factors for bronchogenic carcinoma, follow-up chest CT at 1 year is recommended. This recommendation follows the consensus statement: Guidelines for Management of Small Pulmonary Nodules Detected on CT Scans: A Statement from the [place] as published in Radiology 2005; 237:395-400. Electronically Signed   By: Monte Fantasia M.D.   On: 02/08/2015 12:21   Dg Chest Portable 1 View  02/08/2015  CLINICAL DATA:  Midsternal pain. EXAM: PORTABLE CHEST 1 VIEW COMPARISON:  None. FINDINGS: The heart size and mediastinal contours are within normal limits. Both lungs are clear. The visualized skeletal structures are unremarkable. IMPRESSION: No active disease. Electronically Signed   By: Kathreen Devoid   On: 02/08/2015 10:42   I have personally reviewed and evaluated these images and lab results as part of my medical decision-making.   EKG Interpretation   Date/Time:  Wednesday February 08 2015 10:28:12 EDT Ventricular Rate:  68 PR Interval:  194 QRS Duration: 94 QT Interval:  381 QTC Calculation: 405 R Axis:   31 Text Interpretation:  Sinus rhythm Probable anteroseptal infarct, old No  significant change since last tracing Confirmed by Cherolyn Behrle MD, DANIEL  ([id number]) on 02/08/2015 11:32:47 AM      MDM   Final diagnoses:  Chest pain, unspecified chest pain type    74 yo M with a chief complaint chest pain. Sharp and reproducible on exam. Will obtain a laboratory evaluation with LFTs and lipase. EKG unremarkable. With patient's history of cancer concern for possible PE CT PE scan negative. Will delta troponin.  Delta trop negative, pain completely relieved, suspect GI etiology will have patient take zantac for next week, pcp follow up.  5:48 PM:  I have discussed the diagnosis/risks/treatment options with the patient and believe the pt to be  eligible for discharge home to follow-up with PCP. We also discussed returning to the ED immediately if new or worsening sx occur. We discussed the sx which are most concerning (e.g., sudden worsening pain, fever) that necessitate immediate return. Medications administered to the patient during their visit and any new prescriptions provided to the patient are listed below.  Medications given during this visit Medications  0.9 %  sodium chloride infusion (0 mLs Intravenous Stopped 02/08/15 1451)  nitroGLYCERIN (NITROSTAT) SL tablet 0.4 mg (not administered)  iohexol (OMNIPAQUE) 350 MG/ML injection 100 mL (100 mLs Intravenous Contrast Given 02/08/15 1153)  alum & mag hydroxide-simeth (MAALOX/MYLANTA) 200-200-20 MG/5ML suspension 15 mL (15 mLs Oral Given 02/08/15 1255)  lidocaine (XYLOCAINE) 2 % viscous mouth solution 15 mL (15 mLs Mouth/Throat Given 02/08/15 1256)    Discharge Medication List as of 02/08/2015  1:26 PM      The patient appears reasonably screen and/or stabilized for discharge and I doubt any other medical condition or other [facility] requiring further screening, evaluation, or treatment in the ED at this time prior to discharge.    Deno Etienne, DO 02/08/15 1749

## 2015-02-08 NOTE — Progress Notes (Signed)
Patient presented to the radiation oncology department for treatment clutching his chest complaining of pain. Scooped and transported immediately to emergency room. Patient confirms this is new onset chest pain. Patient with history of prolonged QT waves. Patient denies any radiation of pain down his left arm. No diaphoresis noted. Patient describes the pain as heaviness or fullness in his chest. Report given to [place], RN. Cancelled XRT for today; Raquel Sarna RT for L1 informed.

## 2015-02-08 NOTE — Discharge Instructions (Signed)
Takes Zantac twice a day until you see your PCP. Chest Wall Pain Chest wall pain is pain in or around the bones and muscles of your chest. Sometimes, an injury causes this pain. Sometimes, the cause may not be known. This pain may take several weeks or longer to get better. HOME CARE Pay attention to any changes in your symptoms. Take these actions to help with your pain:  Rest as told by your doctor.  Avoid activities that cause pain. Try not to use your chest, belly (abdominal), or side muscles to lift heavy things.  If directed, apply ice to the painful area:  Put ice in a plastic bag.  Place a towel between your skin and the bag.  Leave the ice on for 20 minutes, 2-3 times per day.  Take over-the-counter and prescription medicines only as told by your doctor.  Do not use tobacco products, including cigarettes, chewing tobacco, and e-cigarettes. If you need help quitting, ask your doctor.  Keep all follow-up visits as told by your doctor. This is important. GET HELP IF:  You have a fever.  Your chest pain gets worse.  You have new symptoms. GET HELP RIGHT AWAY IF:  You feel sick to your stomach (nauseous) or you throw up (vomit).  You feel sweaty or light-headed.  You have a cough with phlegm (sputum) or you cough up blood.  You are short of breath.   This information is not intended to replace advice given to you by your health care provider. Make sure you discuss any questions you have with your health care provider.   Document Released: 09/25/2007 Document Revised: 12/28/2014 Document Reviewed: 07/04/2014 Elsevier Interactive Patient Education [employer].

## 2015-02-08 NOTE — ED Notes (Signed)
Bed: WO03 Expected date:  Expected time:  Means of arrival:  Comments: Cancer patient

## 2015-02-09 ENCOUNTER — Ambulatory Visit: Payer: Medicare Other

## 2015-02-09 ENCOUNTER — Ambulatory Visit
Admit: 2015-02-09 | Discharge: 2015-02-09 | Disposition: A | Discharge status: Home / Self Care | Payer: Medicare Other | Attending: Radiation Oncology | Admitting: Radiation Oncology

## 2015-02-09 DIAGNOSIS — Z510 Encounter for antineoplastic radiation therapy: Secondary | ICD-10-CM | POA: Diagnosis not present

## 2015-02-10 ENCOUNTER — Ambulatory Visit
Admission: RE | Admit: 2015-02-10 | Discharge: 2015-02-10 | Disposition: A | Discharge status: Home / Self Care | Payer: Medicare Other | Source: Ambulatory Visit | Attending: Radiation Oncology | Admitting: Radiation Oncology

## 2015-02-10 ENCOUNTER — Encounter: Payer: Self-pay | Admitting: Radiation Oncology

## 2015-02-10 ENCOUNTER — Ambulatory Visit: Payer: Medicare Other

## 2015-02-10 ENCOUNTER — Ambulatory Visit
Admit: 2015-02-10 | Discharge: 2015-02-10 | Disposition: A | Discharge status: Home / Self Care | Payer: Medicare Other | Attending: Radiation Oncology | Admitting: Radiation Oncology

## 2015-02-10 VITALS — BP 175/85 | HR 78 | Temp 98.3°F | Ht 67.0 in | Wt 167.7 lb

## 2015-02-10 DIAGNOSIS — Z510 Encounter for antineoplastic radiation therapy: Secondary | ICD-10-CM | POA: Diagnosis not present

## 2015-02-10 DIAGNOSIS — C61 Malignant neoplasm of prostate: Secondary | ICD-10-CM

## 2015-02-10 NOTE — Progress Notes (Addendum)
Derek Tanner is here for his last treatment of radiation to his prostate. He reports nocturnal urination is very frequent (he says 19 times last night). He reports occasional burning with urination, but has a strong urine stream. He states he is constipated and has not had a bowel movement in 5 days, I have encouraged him to take a stool softener or laxative to help him have a bowel movement. He is reporting 9/10 pain in is penis today. He is unable to afford pain medicine at this time and is not taking his norco because of this issue. He denies swelling or redness to his penis either at this time, but does report it has felt bad ever since he had a catheter inserted. He is unable to sit without severe pain, and actually was walking around the room for part of my exam.   BP 175/85 mmHg  Pulse 78  Temp(Src) 98.3 F (36.8 C)  Ht 5\' 7"  (1.702 m)  Wt 167 lb 11.2 oz (76.068 kg)  BMI 26.26 kg/m2   Wt Readings from Last 3 Encounters:  02/10/15 167 lb 11.2 oz (76.068 kg)  02/08/15 168 lb (76.204 kg)  02/02/15 167 lb 3.2 oz (75.841 kg)

## 2015-02-10 NOTE — Progress Notes (Signed)
Department of Radiation Oncology  Phone:  [phone] Fax:        [phone]  Weekly Treatment Note    Name: Derek Tanner Date: 02/10/2015 MRN: [id number] DOB: [birth year]   Current dose: 78 Gy  Current fraction:40   MEDICATIONS: Current Outpatient Prescriptions  Medication Sig Dispense Refill  . albuterol (PROVENTIL) (2.5 MG/3ML) 0.083% nebulizer solution Take 2.5 mg by nebulization as needed for wheezing or shortness of breath.    . fluticasone (FLONASE) 50 MCG/ACT nasal spray Place 1 spray into both nostrils as needed for allergies or rhinitis.    Marland Kitchen lisinopril (PRINIVIL,ZESTRIL) 20 MG tablet Take 20 mg by mouth daily.    . VENTOLIN HFA 108 (90 BASE) MCG/ACT inhaler Inhale 1 puff into the lungs every 4 (four) hours as needed for wheezing or shortness of breath.   2  . HYDROcodone-acetaminophen (NORCO) 7.5-325 MG per tablet Take 1 tablet by mouth every 6 (six) hours as needed for moderate pain.   0  . meloxicam (MOBIC) 7.5 MG tablet Take 7.5-15 mg by mouth.    . phenazopyridine (PYRIDIUM) 200 MG tablet Take 1 tablet (200 mg total) by mouth 3 (three) times daily as needed for pain. (Patient not taking: Reported on 02/08/2015) 45 tablet 0  . terazosin (HYTRIN) 5 MG capsule Take 5 mg by mouth at bedtime.   1  . triamcinolone cream (KENALOG) 0.1 % Apply 1 application topically daily as needed (rash).      No current facility-administered medications for this encounter.     ALLERGIES: Review of patient's allergies indicates no known allergies.   LABORATORY DATA:  Lab Results  Component Value Date   WBC 5.6 02/08/2015   HGB 12.3* 02/08/2015   HCT 36.5* 02/08/2015   MCV 86.9 02/08/2015   PLT 290 02/08/2015   Lab Results  Component Value Date   NA 139 02/08/2015   K 4.0 02/08/2015   CL 106 02/08/2015   CO2 29 02/08/2015   Lab Results  Component Value Date   ALT 11* 02/08/2015   AST 13* 02/08/2015   ALKPHOS 53 02/08/2015   BILITOT 0.5 02/08/2015      NARRATIVE: Derek Tanner was seen today for weekly treatment management. The chart was checked and the patient's films were reviewed.  Derek Tanner is here for his last treatment of radiation to his prostate. He reports nocturnal urination is very frequent (he says 19 times last night). He reports occasional burning with urination, but has a strong urine stream. He states he is constipated and has not had a bowel movement in 5 days, I have encouraged him to take a stool softener or laxative to help him have a bowel movement. He is reporting 9/10 pain in is penis today. He is unable to afford pain medicine at this time and is not taking his norco because of this issue. He denies swelling or redness to his penis either at this time, but does report it has felt bad ever since he had a catheter inserted. The patient went to the ER yesterday due to chest pain. The patient was previously referred to [facility] due to a bladder stone.   PHYSICAL EXAMINATION: height is 5\' 7"  (1.702 m) and weight is 167 lb 11.2 oz (76.068 kg). His temperature is 98.3 F (36.8 C). His blood pressure is 175/85 and his pulse is 78.      No acute findings in the penis. No sign of infection.   ASSESSMENT: The patient is  doing satisfactorily with treatment.  PLAN: The patient has completed radiation treatment today. Patient will begin taking advil for pain. Patient states financially he is unable to fill his pain medication from Dr. Tammi Klippel until after the end of the month. Patient will contact Dr. Sammie Bench office for further recommendations if his pain continues.  This document serves as a record of services personally performed by Kyung Rudd, MD. It was created on his behalf by Arlyce Harman, a trained medical scribe. The creation of this record is based on the scribe's personal observations and the provider's statements to them. This document has been checked and approved by the attending  provider. ------------------------------------------------  Jodelle Gross, MD, PhD

## 2015-02-13 ENCOUNTER — Ambulatory Visit: Payer: Medicare Other

## 2015-02-14 ENCOUNTER — Ambulatory Visit: Payer: Medicare Other

## 2015-02-16 ENCOUNTER — Telehealth: Payer: Self-pay | Admitting: Radiation Oncology

## 2015-02-16 ENCOUNTER — Ambulatory Visit: Payer: Medicare Other

## 2015-02-16 NOTE — Telephone Encounter (Signed)
Returned from lunch. Received message to phone patient's wife at [phone]. No answer. Left message. Wife returned call at 53. She explains her husband continues to have severe pain and cries while voiding. Explained this is related to the effects of the stone in his bladder. Strongly advised wife to follow up with urologist. Provided her with the number for [facility]. She verbalized understanding and expressed appreciation for the call.

## 2015-02-18 ENCOUNTER — Emergency Department (HOSPITAL_COMMUNITY)
Admission: EM | Admit: 2015-02-18 | Discharge: 2015-02-18 | Disposition: A | Discharge status: Home / Self Care | Payer: Medicare Other | Attending: Emergency Medicine | Admitting: Emergency Medicine

## 2015-02-18 ENCOUNTER — Emergency Department (HOSPITAL_COMMUNITY): Payer: Medicare Other

## 2015-02-18 ENCOUNTER — Encounter (HOSPITAL_COMMUNITY): Payer: Self-pay | Admitting: Nurse Practitioner

## 2015-02-18 DIAGNOSIS — I10 Essential (primary) hypertension: Secondary | ICD-10-CM | POA: Insufficient documentation

## 2015-02-18 DIAGNOSIS — Z87891 Personal history of nicotine dependence: Secondary | ICD-10-CM | POA: Diagnosis not present

## 2015-02-18 DIAGNOSIS — M545 Low back pain, unspecified: Secondary | ICD-10-CM

## 2015-02-18 DIAGNOSIS — Z8546 Personal history of malignant neoplasm of prostate: Secondary | ICD-10-CM | POA: Insufficient documentation

## 2015-02-18 DIAGNOSIS — J449 Chronic obstructive pulmonary disease, unspecified: Secondary | ICD-10-CM | POA: Diagnosis not present

## 2015-02-18 DIAGNOSIS — Z8659 Personal history of other mental and behavioral disorders: Secondary | ICD-10-CM | POA: Insufficient documentation

## 2015-02-18 DIAGNOSIS — Z8669 Personal history of other diseases of the nervous system and sense organs: Secondary | ICD-10-CM | POA: Diagnosis not present

## 2015-02-18 DIAGNOSIS — Z79899 Other long term (current) drug therapy: Secondary | ICD-10-CM | POA: Insufficient documentation

## 2015-02-18 DIAGNOSIS — Z87442 Personal history of urinary calculi: Secondary | ICD-10-CM | POA: Insufficient documentation

## 2015-02-18 DIAGNOSIS — E119 Type 2 diabetes mellitus without complications: Secondary | ICD-10-CM | POA: Diagnosis not present

## 2015-02-18 DIAGNOSIS — M6281 Muscle weakness (generalized): Secondary | ICD-10-CM | POA: Diagnosis present

## 2015-02-18 MED ORDER — HYDROCODONE-ACETAMINOPHEN 5-325 MG PO TABS
2.0000 | ORAL_TABLET | Freq: Once | ORAL | Status: AC
  Administered 2015-02-18: 2 via ORAL
  Filled 2015-02-18: qty 2

## 2015-02-18 MED ORDER — METHOCARBAMOL 500 MG PO TABS
1000.0000 mg | ORAL_TABLET | Freq: Once | ORAL | Status: AC
  Administered 2015-02-18: 1000 mg via ORAL
  Filled 2015-02-18: qty 2

## 2015-02-18 MED ORDER — HYDROCODONE-ACETAMINOPHEN 5-325 MG PO TABS: 2.0000 | ORAL_TABLET | ORAL | Status: DC | PRN

## 2015-02-18 MED ORDER — METHOCARBAMOL 500 MG PO TABS: 500.0000 mg | ORAL_TABLET | Freq: Three times a day (TID) | ORAL | Status: AC | PRN

## 2015-02-18 MED ORDER — NAPROXEN 500 MG PO TABS: 500.0000 mg | ORAL_TABLET | Freq: Two times a day (BID) | ORAL | Status: AC

## 2015-02-18 NOTE — ED Notes (Signed)
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 He is denying fever, chills, n/v or urinary symptoms.

## 2015-02-18 NOTE — Discharge Instructions (Signed)

## 2015-02-18 NOTE — ED Notes (Signed)
Bed: GO77 Expected date:  Expected time:  Means of arrival:  Comments: EMS- [place] pt, generalized weakness

## 2015-02-18 NOTE — ED Notes (Signed)
Pt taken to x-ray and returned to room without distress noted. 

## 2015-02-18 NOTE — ED Provider Notes (Signed)
CSN: [id number]     Arrival date & time 02/18/15  1550 History   First MD Initiated Contact with Patient 02/18/15 1615     Chief Complaint  Patient presents with  . Generalized Body Weakness       HPI  Disposition dilation of back pain. States that he was blowing leaves today. He has a backpack gas powered blow that he was wearing for 5 hours. Started having pain in his back. He stopped. He went inside and sat down and started having some spasm in his back and presents here. Only significant recent history is completion of radiation for prostate cancer. Per urology reports it is a low-grade non-metastasize prostate cancer. His only treatment thus far has been radiation therapy. He does have urinary catheter that he has had since onset of his diagnosis, and radiation therapy.  No numbness weakness tingling or extremity symptoms. No bowel or bladder symptoms.  Past Medical History  Diagnosis Date  . Type 2 diabetes mellitus ([place])   . Prostate induration     LEFT LOBE  . Anxiety   . History of prolonged Q-T interval on ECG   . Depression   . Hypertension   . COPD (chronic obstructive pulmonary disease) ([place])   . 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 Prostate cancer ([place])   . At risk for sleep apnea     STOP-BANG= 4        SENT TO PCP 11-09-2014   Past Surgical History  Procedure Laterality Date  . Craniotomy for tumor  2002  . Prostate biopsy N/A 08/19/2014    Procedure: BIOPSY TRANSRECTAL ULTRASONIC PROSTATE (TUBP);  Surgeon: Lowella Bandy, MD;  Location: [facility];  Service: Urology;  Laterality: N/A;  Girtha Rm seed implant N/A 11/11/2014    Procedure: GOLD SEED IMPLANT;  Surgeon: Lowella Bandy, MD;  Location: [facility];  Service: Urology;  Laterality: N/A;  [facility] to supply Gold Seeds   Family History  Problem Relation Age of Onset  . Cancer Brother     throat   Social History  Substance Use Topics  . Smoking status: Former Smoker -- 0.50 packs/day for  66 years    Types: Cigarettes    Quit date: 10/26/2014  . Smokeless tobacco: Never Used  . Alcohol Use: No    Review of Systems  Constitutional: Negative for fever, chills, diaphoresis, appetite change and fatigue.  HENT: Negative for mouth sores, sore throat and trouble swallowing.   Eyes: Negative for visual disturbance.  Respiratory: Negative for cough, chest tightness, shortness of breath and wheezing.   Cardiovascular: Negative for chest pain.  Gastrointestinal: Negative for nausea, vomiting, abdominal pain, diarrhea and abdominal distention.  Endocrine: Negative for polydipsia, polyphagia and polyuria.  Genitourinary: Negative for dysuria, frequency and hematuria.  Musculoskeletal: Positive for back pain. Negative for gait problem.  Skin: Negative for color change, pallor and rash.  Neurological: Negative for dizziness, syncope, light-headedness and headaches.  Hematological: Does not bruise/bleed easily.  Psychiatric/Behavioral: Negative for behavioral problems and confusion.      Allergies  Review of patient's allergies indicates no known allergies.  Home Medications   Prior to Admission medications   Medication Sig Start Date End Date Taking? Authorizing Provider  albuterol (PROVENTIL) (2.5 MG/3ML) 0.083% nebulizer solution Take 2.5 mg by nebulization as needed for wheezing or shortness of breath.    Historical Provider, MD  fluticasone (FLONASE) 50 MCG/ACT nasal spray Place 1 spray into both nostrils as needed for allergies  or rhinitis.    Historical Provider, MD  HYDROcodone-acetaminophen (NORCO/VICODIN) 5-325 MG tablet Take 2 tablets by mouth every 4 (four) hours as needed. 02/18/15   Tanna Furry, MD  lisinopril (PRINIVIL,ZESTRIL) 20 MG tablet Take 20 mg by mouth daily.    Historical Provider, MD  meloxicam (MOBIC) 7.5 MG tablet Take 7.5-15 mg by mouth. 09/17/13   Historical Provider, MD  methocarbamol (ROBAXIN) 500 MG tablet Take 1 tablet (500 mg total) by mouth 3  (three) times daily between meals as needed. 02/18/15   Tanna Furry, MD  naproxen (NAPROSYN) 500 MG tablet Take 1 tablet (500 mg total) by mouth 2 (two) times daily. 02/18/15   Tanna Furry, MD  phenazopyridine (PYRIDIUM) 200 MG tablet Take 1 tablet (200 mg total) by mouth 3 (three) times daily as needed for pain. Patient not taking: Reported on 02/08/2015 01/06/15   Tyler Pita, MD  terazosin (HYTRIN) 5 MG capsule Take 5 mg by mouth at bedtime.  07/04/14   Historical Provider, MD  triamcinolone cream (KENALOG) 0.1 % Apply 1 application topically daily as needed (rash).  04/28/13   Historical Provider, MD  VENTOLIN HFA 108 (90 BASE) MCG/ACT inhaler Inhale 1 puff into the lungs every 4 (four) hours as needed for wheezing or shortness of breath.  09/02/14   Historical Provider, MD   BP 140/68 mmHg  Pulse 81  Temp(Src) 98.4 F (36.9 C) (Oral)  Resp 14  SpO2 100% Physical Exam  Constitutional: He is oriented to person, place, and time. He appears well-developed and well-nourished. No distress.  HENT:  Head: Normocephalic.  Eyes: Conjunctivae are normal. Pupils are equal, round, and reactive to light. No scleral icterus.  Neck: Normal range of motion. Neck supple. No thyromegaly present.  Cardiovascular: Normal rate and regular rhythm.  Exam reveals no gallop and no friction rub.   No murmur heard. Pulmonary/Chest: Effort normal and breath sounds normal. No respiratory distress. He has no wheezes. He has no rales.  Abdominal: Soft. Bowel sounds are normal. He exhibits no distension. There is no tenderness. There is no rebound.  Musculoskeletal: Normal range of motion.       Back:  Neurological: He is alert and oriented to person, place, and time.  Normal symmetric Strength to shoulder shrug, triceps, biceps, grip,wrist flex/extend,and intrinsics  Norma lsymmetric sensation above and below clavicles, and to all distributions to UEs. Norma symmetric strength to flex/.extend hip and knees,  dorsi/plantar flex ankles. Normal symmetric sensation to all distributions to LEs Patellar and achilles reflexes 1-2+. Downgoing Babinski   Skin: Skin is warm and dry. No rash noted.  Psychiatric: He has a normal mood and affect. His behavior is normal.    ED Course  Procedures (including critical care time) Labs Review Labs Reviewed - No data to display  Imaging Review Dg Lumbar Spine Complete  02/18/2015  CLINICAL DATA:  Sharp pain in lower back while working today. Just completed radiation treatment for prostate cancer. EXAM: LUMBAR SPINE - COMPLETE 4+ VIEW COMPARISON:  None. FINDINGS: Degenerative changes are seen throughout the mid and lower lumbar spine, moderate in degree, with associated disc space narrowings, osseous spurring, and vacuum disc. Associated neural foramen narrowings noted bilaterally. Minimal retrolisthesis of L5 likely related to the underlying degenerative changes. Osseous alignment otherwise normal. No fracture line or displaced fracture fragment seen. No acute- appearing cortical irregularity or osseous lesion. No evidence of blastic metastases. Atherosclerotic changes noted along the walls of the adjacent in for abdominal aorta. Cholecystectomy clips in  the right upper quadrant. Small calcifications in lower pelvis likely vascular phleboliths. Paravertebral soft tissues otherwise unremarkable. IMPRESSION: 1. Degenerative changes within the mid and lower lumbar spine, moderate in degree, as detailed above. Associated neural foramen narrowings noted at the L4-5 and L5-S1 levels with possible associated nerve root impingements. This could be better characterized with nonemergent lumbar spine MRI if clinically needed. 2. No acute findings. No fracture or dislocation. No evidence of osseous metastasis. Electronically Signed   By: Franki Cabot M.D.   On: 02/18/2015 17:16   I have personally reviewed and evaluated these images and lab results as part of my medical  decision-making.   EKG Interpretation None      MDM   Final diagnoses:  Midline low back pain without sciatica    No red flag symptoms or findings. No abnormal vertebrae on plain films. No indication for further imaging at this time. He has a clear etiology for his symptoms. Plan is home, anti-inflammatories, pain medicine, muscle relaxant, primary care follow-up.    Tanna Furry, MD 02/18/15 [phone]

## 2015-02-18 NOTE — ED Notes (Signed)
Awake. Verbally responsive. A/O x4. Resp even and unlabored. No audible adventitious breath sounds noted. ABC's intact.  

## 2015-02-20 ENCOUNTER — Other Ambulatory Visit: Payer: Self-pay | Admitting: Urology

## 2015-02-20 ENCOUNTER — Telehealth: Payer: Self-pay | Admitting: Radiation Oncology

## 2015-02-20 NOTE — Telephone Encounter (Signed)
Received call from patient's wife, Derek Tanner. She states, "thank you for your help my husband finally has relief." She goes onto explain that she accompanied her husband to his appointment with [facility]. She summaries that her husband required a foley catheter and is being scheduled for surgery to remove the stone from his bladder. Encouraged her to call with future needs. She verbalized understanding.

## 2015-02-21 ENCOUNTER — Encounter (HOSPITAL_BASED_OUTPATIENT_CLINIC_OR_DEPARTMENT_OTHER): Payer: Self-pay | Admitting: *Deleted

## 2015-02-21 NOTE — Progress Notes (Addendum)
NPO AFTER MN.  ARRIVE AT 8315.  NEEDS BMET, PT/INR, PTT, AND PSA.  CURRENT EKG IN CHART AND EPIC Called and spoke w/ pt verbalized understanding arrival time at 1015 instead of 1045 and bring med list from home so that this can be updated in epic. Marland Kitchen

## 2015-02-27 ENCOUNTER — Ambulatory Visit (HOSPITAL_BASED_OUTPATIENT_CLINIC_OR_DEPARTMENT_OTHER): Payer: Medicare Other | Admitting: Anesthesiology

## 2015-02-27 ENCOUNTER — Encounter (HOSPITAL_BASED_OUTPATIENT_CLINIC_OR_DEPARTMENT_OTHER): Payer: Self-pay | Admitting: *Deleted

## 2015-02-27 ENCOUNTER — Encounter (HOSPITAL_BASED_OUTPATIENT_CLINIC_OR_DEPARTMENT_OTHER)
Admission: RE | Disposition: A | Discharge status: Home / Self Care | Payer: Self-pay | Source: Ambulatory Visit | Attending: Urology

## 2015-02-27 ENCOUNTER — Ambulatory Visit (HOSPITAL_BASED_OUTPATIENT_CLINIC_OR_DEPARTMENT_OTHER)
Admission: RE | Admit: 2015-02-27 | Discharge: 2015-02-27 | Disposition: A | Discharge status: Home / Self Care | Payer: Medicare Other | Source: Ambulatory Visit | Attending: Urology | Admitting: Urology

## 2015-02-27 DIAGNOSIS — Z7984 Long term (current) use of oral hypoglycemic drugs: Secondary | ICD-10-CM | POA: Insufficient documentation

## 2015-02-27 DIAGNOSIS — Z791 Long term (current) use of non-steroidal anti-inflammatories (NSAID): Secondary | ICD-10-CM | POA: Insufficient documentation

## 2015-02-27 DIAGNOSIS — J449 Chronic obstructive pulmonary disease, unspecified: Secondary | ICD-10-CM | POA: Diagnosis not present

## 2015-02-27 DIAGNOSIS — J45909 Unspecified asthma, uncomplicated: Secondary | ICD-10-CM | POA: Insufficient documentation

## 2015-02-27 DIAGNOSIS — R109 Unspecified abdominal pain: Secondary | ICD-10-CM | POA: Insufficient documentation

## 2015-02-27 DIAGNOSIS — Z79899 Other long term (current) drug therapy: Secondary | ICD-10-CM | POA: Diagnosis not present

## 2015-02-27 DIAGNOSIS — R309 Painful micturition, unspecified: Secondary | ICD-10-CM | POA: Diagnosis present

## 2015-02-27 DIAGNOSIS — G4730 Sleep apnea, unspecified: Secondary | ICD-10-CM | POA: Diagnosis not present

## 2015-02-27 DIAGNOSIS — C61 Malignant neoplasm of prostate: Secondary | ICD-10-CM | POA: Diagnosis not present

## 2015-02-27 DIAGNOSIS — E119 Type 2 diabetes mellitus without complications: Secondary | ICD-10-CM | POA: Insufficient documentation

## 2015-02-27 DIAGNOSIS — R339 Retention of urine, unspecified: Secondary | ICD-10-CM | POA: Diagnosis not present

## 2015-02-27 DIAGNOSIS — I10 Essential (primary) hypertension: Secondary | ICD-10-CM | POA: Diagnosis not present

## 2015-02-27 DIAGNOSIS — N210 Calculus in bladder: Secondary | ICD-10-CM | POA: Insufficient documentation

## 2015-02-27 DIAGNOSIS — H409 Unspecified glaucoma: Secondary | ICD-10-CM | POA: Diagnosis not present

## 2015-02-27 DIAGNOSIS — F17200 Nicotine dependence, unspecified, uncomplicated: Secondary | ICD-10-CM | POA: Insufficient documentation

## 2015-02-27 HISTORY — PX: CYSTOSCOPY WITH LITHOLAPAXY: SHX1425

## 2015-02-27 HISTORY — DX: Retention of urine, unspecified: R33.9

## 2015-02-27 HISTORY — DX: Calculus in bladder: N21.0

## 2015-02-27 HISTORY — DX: Presence of urogenital implants: Z96.0

## 2015-02-27 HISTORY — PX: HOLMIUM LASER APPLICATION: SHX5852

## 2015-02-27 HISTORY — DX: Presence of other specified devices: Z97.8

## 2015-02-27 LAB — BASIC METABOLIC PANEL
Anion gap: 6 (ref 5–15)
BUN: 13 mg/dL (ref 6–20)
CALCIUM: 9.3 mg/dL (ref 8.9–10.3)
CO2: 28 mmol/L (ref 22–32)
CREATININE: 0.74 mg/dL (ref 0.61–1.24)
Chloride: 106 mmol/L (ref 101–111)
GFR calc Af Amer: >60 mL/min (ref >60)
GFR calc non Af Amer: >60 mL/min (ref >60)
GLUCOSE: 145 mg/dL — AB (ref 65–99)
Potassium: 4.2 mmol/L (ref 3.5–5.1)
Sodium: 140 mmol/L (ref 135–145)

## 2015-02-27 LAB — APTT: aPTT: 28 seconds (ref 24–37)

## 2015-02-27 LAB — GLUCOSE, CAPILLARY: GLUCOSE-CAPILLARY: 123 mg/dL — AB (ref 65–99)

## 2015-02-27 LAB — PROTIME-INR
INR: 0.95 (ref 0.00–1.49)
Prothrombin Time: 12.9 seconds (ref 11.6–15.2)

## 2015-02-27 LAB — PSA: PSA: 0.17 ng/mL (ref 0.00–4.00)

## 2015-02-27 SURGERY — CYSTOSCOPY, WITH BLADDER CALCULUS LITHOLAPAXY
Anesthesia: General

## 2015-02-27 MED ORDER — LACTATED RINGERS IV SOLN
INTRAVENOUS | Status: DC
  Administered 2015-02-27: 11:00:00 via INTRAVENOUS
  Filled 2015-02-27: qty 1000

## 2015-02-27 MED ORDER — ONDANSETRON HCL 4 MG/2ML IJ SOLN
INTRAMUSCULAR | Status: DC | PRN
  Administered 2015-02-27: 4 mg via INTRAVENOUS

## 2015-02-27 MED ORDER — LACTATED RINGERS IV SOLN
INTRAVENOUS | Status: DC
  Administered 2015-02-27 (×2): via INTRAVENOUS
  Filled 2015-02-27: qty 1000

## 2015-02-27 MED ORDER — ACETAMINOPHEN 10 MG/ML IV SOLN
INTRAVENOUS | Status: DC | PRN
  Administered 2015-02-27: 1000 mg via INTRAVENOUS

## 2015-02-27 MED ORDER — GENTAMICIN SULFATE 40 MG/ML IJ SOLN
5.0000 mg/kg | INTRAVENOUS | Status: AC
  Administered 2015-02-27: 360 mg via INTRAVENOUS
  Filled 2015-02-27 (×2): qty 9

## 2015-02-27 MED ORDER — HYDROCODONE-ACETAMINOPHEN 5-325 MG PO TABS: 2.0000 | ORAL_TABLET | ORAL | Status: AC | PRN

## 2015-02-27 MED ORDER — SODIUM CHLORIDE 0.9 % IV SOLN
2.0000 g | INTRAVENOUS | Status: AC
  Administered 2015-02-27: 2 g via INTRAVENOUS
  Filled 2015-02-27: qty 2000

## 2015-02-27 MED ORDER — SODIUM CHLORIDE 0.9 % IV SOLN
INTRAVENOUS | Status: AC
  Filled 2015-02-27: qty 100

## 2015-02-27 MED ORDER — WATER FOR IRRIGATION, STERILE IR SOLN
Status: DC | PRN
  Administered 2015-02-27: 3000 mL via INTRAVESICAL

## 2015-02-27 MED ORDER — LIDOCAINE HCL (CARDIAC) 20 MG/ML IV SOLN
INTRAVENOUS | Status: DC | PRN
Start: 2015-02-27 — End: 2015-02-27
  Administered 2015-02-27: 60 mg via INTRAVENOUS

## 2015-02-27 MED ORDER — PROPOFOL 10 MG/ML IV BOLUS
INTRAVENOUS | Status: DC | PRN
  Administered 2015-02-27 (×2): 25 mg via INTRAVENOUS
  Administered 2015-02-27: 150 mg via INTRAVENOUS

## 2015-02-27 MED ORDER — MIDAZOLAM HCL 2 MG/2ML IJ SOLN
INTRAMUSCULAR | Status: AC
  Filled 2015-02-27: qty 2

## 2015-02-27 MED ORDER — CIPROFLOXACIN HCL 500 MG PO TABS: 500.0000 mg | ORAL_TABLET | Freq: Two times a day (BID) | ORAL | Status: DC

## 2015-02-27 MED ORDER — GENTAMICIN SULFATE 40 MG/ML IJ SOLN
1.5000 mg/kg | INTRAVENOUS | Status: DC
  Filled 2015-02-27: qty 2.75

## 2015-02-27 MED ORDER — KETOROLAC TROMETHAMINE 30 MG/ML IJ SOLN
INTRAMUSCULAR | Status: DC | PRN
Start: 2015-02-27 — End: 2015-02-27
  Administered 2015-02-27: 30 mg via INTRAVENOUS

## 2015-02-27 MED ORDER — FENTANYL CITRATE (PF) 100 MCG/2ML IJ SOLN
INTRAMUSCULAR | Status: DC | PRN
  Administered 2015-02-27 (×8): 25 ug via INTRAVENOUS

## 2015-02-27 MED ORDER — AMPICILLIN SODIUM 1 G IJ SOLR
INTRAMUSCULAR | Status: AC
  Filled 2015-02-27: qty 2000

## 2015-02-27 MED ORDER — FENTANYL CITRATE (PF) 100 MCG/2ML IJ SOLN
INTRAMUSCULAR | Status: AC
  Filled 2015-02-27: qty 4

## 2015-02-27 SURGICAL SUPPLY — 23 items
BAG URINE LEG 19OZ MD ST LTX (BAG) ×3 IMPLANT
BAG URO CATCHER STRL LF (DRAPE) ×3 IMPLANT
CATH FOLEY 2WAY SLVR  5CC 20FR (CATHETERS) ×2
CATH FOLEY 2WAY SLVR 5CC 20FR (CATHETERS) ×1 IMPLANT
EVACUATOR MICROVAS BLADDER (UROLOGICAL SUPPLIES) ×3 IMPLANT
FIBER LASER FLEXIVA 1000 (UROLOGICAL SUPPLIES) ×3 IMPLANT
FIBER LASER FLEXIVA 550 (UROLOGICAL SUPPLIES) IMPLANT
GLOVE BIO SURGEON STRL SZ7.5 (GLOVE) ×3 IMPLANT
GOWN L4 LG 24 PK N/S (GOWN DISPOSABLE) IMPLANT
GOWN STRL REUS W/ TWL LRG LVL3 (GOWN DISPOSABLE) ×1 IMPLANT
GOWN STRL REUS W/ TWL XL LVL3 (GOWN DISPOSABLE) ×1 IMPLANT
GOWN STRL REUS W/TWL LRG LVL3 (GOWN DISPOSABLE) ×2
GOWN STRL REUS W/TWL XL LVL3 (GOWN DISPOSABLE) ×2
GUIDEWIRE STR DUAL SENSOR (WIRE) ×3 IMPLANT
KIT ROOM TURNOVER WOR (KITS) ×3 IMPLANT
MANIFOLD NEPTUNE II (INSTRUMENTS) ×3 IMPLANT
PACK CYSTOSCOPY (CUSTOM PROCEDURE TRAY) ×3 IMPLANT
SCRUB PCMX 4 OZ (MISCELLANEOUS) ×3 IMPLANT
SYRINGE IRR TOOMEY STRL 70CC (SYRINGE) IMPLANT
TUBE CONNECTING 12'X1/4 (SUCTIONS) ×1
TUBE CONNECTING 12X1/4 (SUCTIONS) ×2 IMPLANT
WATER STERILE IRR 3000ML UROMA (IV SOLUTION) ×3 IMPLANT
WATER STERILE IRR 500ML POUR (IV SOLUTION) ×9 IMPLANT

## 2015-02-27 NOTE — Op Note (Signed)
Preoperative diagnosis: Bladder stone, urinary retention, and prostate cancer  Postoperative diagnosis: Same  Procedure: Cystolitholapaxy  Surgeon: Dr. Baruch Gouty  Specimens: Bladder stone  Anesthesia: Gen.  Drains: 20 French Foley catheter  Findings: 3-4 cm bladder stone  Disposition: Stable to postanesthesia care unit  Indications procedure: The patient 74 year old male with prostate cancer status post IMRT who presented to the office with severe suprapubic pain. He was found to be in urinary retention with a postvoid residual of 90 cc. He is also found to have a bladder stone on cystoscopy. He presents today for cystolitholapaxy.  Description of procedure: The patient was met in the preoperative area. All risks, benefits, and indications of the procedure were described in great detail. The patient consented to the procedure. The patient was given preoperative antibiotics. He was taken back to the operative theater. Gen. anesthesia was induced per the anesthesia service. Patient was then placed in dorsal lithotomy position. He was prepped and draped in usual sterile fashion. Timeout was called. Next 21 French 30 cystoscope was inserted into the patient's bladder per urethra atraumatically. Pan cystoscopy was then performed. The patient was found have a very capacious bladder. There is a large bladder stone approximately 3-4 cm in size. Pan cystoscopy was otherwise normal. It is broken into small fragments with a thousand micron laser. All fragments were removed via the Ellik evacuator. Repeat cystoscopy showed no residual stone fragment. The fragments in that were sent to pathology. A 20 French Foley catheter was placed to dependent drainage.  Plan: The patient will follow-up for trial of void. He will more than likely need urodynamic studies. He'll continue Foley catheter for now. He may benefit from clean intermittent catheterizations of the Foley in the future.

## 2015-02-27 NOTE — H&P (Signed)
Chief Complaint Prostate Cancer   History of Present Illness       Mr Derek Tanner returns with his wife to discuss treatment of his prostate cancer. He has Gleason 3+4 and 4+3 in all cores. PSA is 5.6. 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 As per the [facility] nomograms he has 38% probability of organ confined disease, 59% risk of extracapsular extension, 8% risk of seminal vesicle invasion and 8% risk of lymph node involvement. I went over the treatment options with them. He is not a candidate for active surveillance. The options are: radical prostatectomy. They understand that he has a high risk of extraprostatic extension and in that case would need adjuvant radiation therapy, IMRT, cryoablation, HIFU. He is not a candidate for seeds implantation alone.  He is interested in radiation therapy. He will need 6-12 months of neoadjuvant androgen deprivation.  I will refer him to Radiation-Oncology for consultation.    Interval history:  The patient has completed his radiation treatment with IMRT in mid October 2016. He returns today with severe voiding symptoms. He screams in a knee per his wife when he tries to urinate. His PVR today is 900 cc. He was told that he has a bladder stone though there is no record of this in our system or images to confirm this.   Past Medical History Problems  1. History of Anxiety (F41.9) 2. 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 History of asthma (Z87.09) 4. History of cardiac arrhythmia (Z86.79) 5. History of depression (Z86.59) 6. History of diabetes mellitus (Z86.39) 7. History of glaucoma (Z86.69) 8. History of hypertension (Z86.79) 9. History of sleep apnea (Z87.09)  Surgical History Problems  1. History of Brain Surgery 2. History of Needle Biopsy Of Prostate  Current Meds 1. Lisinopril TABS;  Therapy: (Recorded:31Dec2015) to Recorded 2. Lupron Depot 30 MG Intramuscular Kit; INJECT 30 MG Intramuscular; To Be Done:  27Oct2016; Status: HOLD FOR - Administration Ordered 3. Meloxicam TABS;  Therapy: (Recorded:31Dec2015) to Recorded 4. MetFORMIN HCl TABS;  Therapy: (Recorded:31Dec2015) to Recorded 5. Oxybutynin Chloride 5 MG Oral Tablet; TAKE 1 TABLET 4 times daily PRN For bladder  spasms;  Therapy: 27Oct2016 to (Evaluate:26Nov2016); Last Rx:27Oct2016 Ordered 6. Phenazopyridine HCl - 200 MG Oral Tablet; TAKE 1 TABLET 3 TIMES DAILY AFTER  MEALS AS NEEDED;  Therapy: 10Oct2016 to (Evaluate:20Oct2016); Last Rx:10Oct2016 Ordered 7. ProAir HFA AERS;  Therapy: (Recorded:31Dec2015) to Recorded  Allergies Medication  1. No Known Drug Allergies  Family History Problems  1. Family history of Throat cancer : Brother  Social History Problems  1. Denied: History of Alcohol use 2. Caffeine use (F15.90) 3. Current smoker (F17.200) 4. Father deceased 58. Married 6. Mother deceased  Review of Systems Genitourinary, constitutional, skin, eye, otolaryngeal, hematologic/lymphatic, cardiovascular, pulmonary, endocrine, musculoskeletal, gastrointestinal, neurological and psychiatric system(s) were reviewed and pertinent findings if present are noted and are otherwise negative.    Vitals Vital Signs [Data Includes: Last 1 Day]  Recorded: 28Oct2016 01:51PM  Weight: 166 lb  BMI Calculated: 26 BSA Calculated: 1.87 Blood Pressure: 176 / 82 Temperature: 98.5 F Heart Rate: 106 Respiration: 18  Physical Exam Constitutional: Well nourished and well developed . No acute distress.   ENT:. The ears and nose are normal in appearance.   Neck: The appearance of the neck is normal.   Pulmonary: No respiratory distress.   Cardiovascular:. No peripheral edema.   Abdomen: The abdomen is not distended. The abdomen is soft and nontender.   Rectal: The prostate exam was  deferred.   Genitourinary: Examination of the penis demonstrates no lesions and a normal meatus. The penis is uncircumcised. The scrotum is normal in  appearance. The right testis is palpably normal. The left testis is normal.   Skin: Normal skin turgor.   Neuro/Psych:. Mood and affect are appropriate.    Results/Data Urine [Data Includes: Last 1 Day]   28Oct2016  COLOR YELLOW   APPEARANCE CLOUDY   SPECIFIC GRAVITY 1.020   pH 5.5   GLUCOSE TRACE   BILIRUBIN NEGATIVE   KETONE NEGATIVE   BLOOD 3+   PROTEIN 2+   NITRITE NEGATIVE   LEUKOCYTE ESTERASE 2+   SQUAMOUS EPITHELIAL/HPF 0-5 HPF  WBC >60 WBC/HPF  RBC 10-20 RBC/HPF  BACTERIA NONE SEEN HPF  CRYSTALS See Below HPF  CASTS NONE SEEN LPF  Yeast NONE SEEN HPF   PVR: 900 cc   Procedure  Procedure:   Procedure Note:  Bladder: Visulization was clear. The ureteral orifices were in the normal anatomic position bilaterally. A single  a stone was present measuring approximately 4 cm in size.          These units were connected to the Cool Wave Control Unit. Once the calibration process was satisfactorily completed the treatment was started.           Post procedure instructions were given and a follow up appointment made. (COOLED THERMOTHERAPY: NOT PERFORMED - EMR AUTOPOPULATION ERROR)    Assessment Assessed  1. Bladder calculus (N21.0)  Plan Bladder calculus  1. Cysto; Status:Hold For - Appointment,Date of Service; Requested HKV:[id number];  2. PVR U/S; Status:Complete;   Done: 28Oct2016 Health Maintenance  3. UA With REFLEX; [Do Not Release]; Status:Resulted - Requires Verification;   Done:  28Oct2016 02:00PM   1. Urinary retention  The patient had 900 cc PVR. He was in a tremendous amount of pain from his urinary retention. We placed a Foley catheter. He will need this for an extended period of time to decompress his bladder.    2. Bladder stone  The patient has a bladder stone likely from his chronic urinary stasis. We'll plan for cystolitholapaxy in the near future.    3. Prostate cancer   Mardene Celeste Gleason 7 prostate cancer and finished IMRT in mid  October 2016. He is due for a shot of Lupron at this time. He continues for a total of 6-12    months. Patient was given lupron 1  1,_0 mg IM.    40 minutes was spent taking care of this patient, over half of which was in direct face to face consultation.  1      1 Amended By: Baruch Gouty; Feb 20 2015 10:01 AM EST  2 Amended By: Baruch Gouty; Feb 20 2015 10:03 AM EST  Signatures Electronically signed by : Baruch Gouty, M.D.; Feb 20 2015 10:04AM EST

## 2015-02-27 NOTE — Anesthesia Postprocedure Evaluation (Signed)
Anesthesia Post Note  Patient: Derek Tanner  Procedure(s) Performed: Procedure(s) (LRB): CYSTOSCOPY WITH LITHOLAPAXY (N/A) HOLMIUM LASER APPLICATION (N/A)  Anesthesia type: general  Patient location: PACU  Post pain: Pain level controlled  Post assessment: Patient's Cardiovascular Status Stable  Last Vitals:  Filed Vitals:   02/27/15 1350  BP:   Pulse: 68  Temp:   Resp: 13    Post vital signs: Reviewed and stable  Level of consciousness: sedated  Complications: No apparent anesthesia complications

## 2015-02-27 NOTE — Anesthesia Preprocedure Evaluation (Addendum)
Anesthesia Evaluation  Patient identified by MRN, date of birth, ID band Patient awake    Reviewed: Allergy & Precautions, NPO status , Patient's Chart, lab work & pertinent test results  Airway Mallampati: III  TM Distance: >3 FB Neck ROM: Full    Dental  (+) Edentulous Upper, Edentulous Lower, Dental Advisory Given   Pulmonary COPD,  COPD inhaler, former smoker,  Stop bang 4   Pulmonary exam normal breath sounds clear to auscultation       Cardiovascular hypertension, Pt. on medications Normal cardiovascular exam Rhythm:Regular Rate:Normal  Prolonged QT   Neuro/Psych Guillian Barre 2002 negative neurological ROS  negative psych ROS   GI/Hepatic negative GI ROS, Neg liver ROS,   Endo/Other  diabetes, Well Controlled, Type 2, Oral Hypoglycemic Agents  Renal/GU negative Renal ROS  negative genitourinary   Musculoskeletal negative musculoskeletal ROS (+)   Abdominal   Peds negative pediatric ROS (+)  Hematology negative hematology ROS (+)   Anesthesia Other Findings   Reproductive/Obstetrics negative OB ROS                            Anesthesia Physical Anesthesia Plan  ASA: III  Anesthesia Plan: General   Post-op Pain Management:    Induction: Intravenous  Airway Management Planned: LMA  Additional Equipment:   Intra-op Plan:   Post-operative Plan: Extubation in OR  Informed Consent:   Plan Discussed with: Surgeon  Anesthesia Plan Comments:         Anesthesia Quick Evaluation                                  Anesthesia Evaluation  Patient identified by MRN, date of birth, ID band Patient awake    Reviewed: Allergy & Precautions, NPO status , Patient's Chart, lab work & pertinent test results  Airway Mallampati: II  TM Distance: >3 FB Neck ROM: Full    Dental no notable dental hx.    Pulmonary COPD COPD inhaler, former smoker,  breath sounds clear  to auscultation  Pulmonary exam normal       Cardiovascular hypertension, Normal cardiovascular examRhythm:Regular Rate:Normal     Neuro/Psych negative neurological ROS  negative psych ROS   GI/Hepatic negative GI ROS, Neg liver ROS,   Endo/Other  diabetes, Type 2  Renal/GU negative Renal ROS  negative genitourinary   Musculoskeletal negative musculoskeletal ROS (+)   Abdominal   Peds negative pediatric ROS (+)  Hematology negative hematology ROS (+)   Anesthesia Other Findings   Reproductive/Obstetrics negative OB ROS                             Anesthesia Physical Anesthesia Plan  ASA: III  Anesthesia Plan: General   Post-op Pain Management:    Induction: Intravenous  Airway Management Planned: LMA  Additional Equipment:   Intra-op Plan:   Post-operative Plan: Extubation in OR  Informed Consent: I have reviewed the patients History and Physical, chart, labs and discussed the procedure including the risks, benefits and alternatives for the proposed anesthesia with the patient or authorized representative who has indicated his/her understanding and acceptance.   Dental advisory given  Plan Discussed with: CRNA and Surgeon  Anesthesia Plan Comments:         Anesthesia Quick Evaluation

## 2015-02-27 NOTE — Anesthesia Procedure Notes (Signed)
Procedure Name: LMA Insertion Date/Time: 02/27/2015 12:30 PM Performed by: Justice Rocher Pre-anesthesia Checklist: Patient identified, Emergency Drugs available, Suction available and Patient being monitored Patient Re-evaluated:Patient Re-evaluated prior to inductionOxygen Delivery Method: Circle System Utilized Preoxygenation: Pre-oxygenation with 100% oxygen Intubation Type: IV induction Ventilation: Mask ventilation without difficulty LMA: LMA inserted LMA Size: 5.0 Number of attempts: 1 Airway Equipment and Method: bite block Placement Confirmation: positive ETCO2 Tube secured with: Tape Dental Injury: Teeth and Oropharynx as per pre-operative assessment

## 2015-02-27 NOTE — Discharge Instructions (Signed)
CYSTOSCOPY HOME CARE INSTRUCTIONS ° °Activity: °Rest for the remainder of the day.  Do not drive or operate equipment today.  You may resume normal activities in one to two days as instructed by your physician.  ° °Meals: °Drink plenty of liquids and eat light foods such as gelatin or soup this evening.  You may return to a normal meal plan tomorrow. ° °Return to Work: °You may return to work in one to two days or as instructed by your physician. ° °Special Instructions / Symptoms: °Call your physician if any of these symptoms occur: ° ° -persistent or heavy bleeding ° -bleeding which continues after first few urination ° -large blood clots that are difficult to pass ° -urine stream diminishes or stops completely ° -fever equal to or higher than 101 degrees Farenheit. ° -cloudy urine with a strong, foul odor ° -severe pain ° °Females should always wipe from front to back after elimination.  You may feel some burning pain when you urinate.  This should disappear with time.  Applying moist heat to the lower abdomen or a hot tub bath may help relieve the pain.  ° °Follow-Up / Date of Return Visit to Your Physician: Call for an appointment to arrange follow-up. ° ° ° ° ° °Post Anesthesia Home Care Instructions ° °Activity: °Get plenty of rest for the remainder of the day. A responsible adult should stay with you for 24 hours following the procedure.  °For the next 24 hours, DO NOT: °-Drive a car °-Operate machinery °-Drink alcoholic beverages °-Take any medication unless instructed by your physician °-Make any legal decisions or sign important papers. ° °Meals: °Start with liquid foods such as gelatin or soup. Progress to regular foods as tolerated. Avoid greasy, spicy, heavy foods. If nausea and/or vomiting occur, drink only clear liquids until the nausea and/or vomiting subsides. Call your physician if vomiting continues. ° °Special Instructions/Symptoms: °Your throat may feel dry or sore from the anesthesia or the  breathing tube placed in your throat during surgery. If this causes discomfort, gargle with warm salt water. The discomfort should disappear within 24 hours. ° °If you had a scopolamine patch placed behind your ear for the management of post- operative nausea and/or vomiting: ° °1. The medication in the patch is effective for 72 hours, after which it should be removed.  Wrap patch in a tissue and discard in the trash. Wash hands thoroughly with soap and water. °2. You may remove the patch earlier than 72 hours if you experience unpleasant side effects which may include dry mouth, dizziness or visual disturbances. °3. Avoid touching the patch. Wash your hands with soap and water after contact with the patch. °  ° °

## 2015-02-27 NOTE — Transfer of Care (Signed)
Immediate Anesthesia Transfer of Care Note  Patient: Derek Tanner  Procedure(s) Performed: Procedure(s) (LRB): CYSTOSCOPY WITH LITHOLAPAXY (N/A) HOLMIUM LASER APPLICATION (N/A)  Patient Location: PACU  Anesthesia Type: General  Level of Consciousness: awake, sedated, patient cooperative and responds to stimulation  Airway & Oxygen Therapy: Patient Spontanous Breathing and Patient connected to face mask oxygen  Post-op Assessment: Report given to PACU RN, Post -op Vital signs reviewed and stable and Patient moving all extremities  Post vital signs: Reviewed and stable  Complications: No apparent anesthesia complicationsImmediate Anesthesia Transfer of Care Note  Patient: Derek Tanner  Procedure(s) Performed: Procedure(s) (LRB): CYSTOSCOPY WITH LITHOLAPAXY (N/A) HOLMIUM LASER APPLICATION (N/A)  Patient Location: PACU  Anesthesia Type: General  Level of Consciousness: awake, sedated, patient cooperative and responds to stimulation  Airway & Oxygen Therapy: Patient Spontanous Breathing and Patient connected to face mask oxygen  Post-op Assessment: Report given to PACU RN, Post -op Vital signs reviewed and stable and Patient moving all extremities  Post vital signs: Reviewed and stable  Complications: No apparent anesthesia complications

## 2015-02-28 ENCOUNTER — Encounter (HOSPITAL_BASED_OUTPATIENT_CLINIC_OR_DEPARTMENT_OTHER): Payer: Self-pay | Admitting: Urology

## 2015-03-01 LAB — POCT HEMOGLOBIN-HEMACUE: Hemoglobin: 11.8 g/dL — ABNORMAL LOW (ref 13.0–17.0)

## 2015-03-18 NOTE — Progress Notes (Signed)
  Radiation Oncology         [phone] ________________________________  Name: BECKHAM DASILVA MRN: [id number]  Date: 02/10/2015  DOB: [birth year]  End of Treatment Note   ICD-9-CM ICD-10-CM    1. 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     Indication for treatment:  Curative, Definitive Radiotherapy       Radiation treatment dates:   12/13/2014-02/10/2015  Site/dose:   The prostate was treated to 78 Gy in 40 fractions of 1.95 Gy  Beams/energy:   The patient was treated with IMRT using volumetric arc therapy delivering 6 MV X-rays to clockwise and counterclockwise circumferential arcs with a 90 degree collimator offset to avoid dose scalloping.  Image guidance was performed with daily cone beam CT prior to each fraction to align to gold markers in the prostate and assure proper bladder and rectal fill volumes.  Immobilization was achieved with BodyFix custom mold.  Narrative: The patient tolerated radiation treatment relatively well.   The patient experienced some minor urinary irritation, constipation, and modest fatigue.   Plan: The patient has completed radiation treatment. He will return to radiation oncology clinic for routine followup in one month. I advised him to call or return sooner if he has any questions or concerns related to his recovery or treatment. ________________________________  Sheral Apley. Tammi Klippel, M.D.  This document serves as a record of services personally performed by Tyler Pita, MD. It was created on his behalf by Arlyce Harman, a trained medical scribe. The creation of this record is based on the scribe's personal observations and the provider's statements to them. This document has been checked and approved by the attending provider.

## 2015-03-23 ENCOUNTER — Ambulatory Visit
Admission: RE | Admit: 2015-03-23 | Discharge: 2015-03-23 | Disposition: A | Discharge status: Home / Self Care | Payer: Medicare Other | Source: Ambulatory Visit | Attending: Radiation Oncology | Admitting: Radiation Oncology

## 2015-04-05 NOTE — Progress Notes (Signed)
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    ICD-9-CM ICD-10-CM   1. Malignant neoplasm of prostate ([facility]) 185 C61     Interval Since Last Radiation:  7  weeks  Radiation treatment dates:   12/13/2014-02/10/2015  Narrative:  The patient returns today for routine follow-up.  Denies pain. Reports intermittent pain associated with urination but, denies this is dysuria. Reports this pain only occurs when he drink caffeine. Denies hematuria. Denies diarrhea. Reports intermittent hesitancy. Confirms he empties his bladder completely. Denies incontinence. Nocturia x 2. Scheduled to follow up with urologist 04/10/15                              ALLERGIES:  has No Known Allergies.  Meds: Current Outpatient Prescriptions  Medication Sig Dispense Refill  . albuterol (PROVENTIL) (2.5 MG/3ML) 0.083% nebulizer solution Take 2.5 mg by nebulization as needed for wheezing or shortness of breath.    . D-5000 MAXIMUM STRENGTH 5000 UNITS capsule Take 5,000 Units by mouth daily.  3  . fluticasone (FLONASE) 50 MCG/ACT nasal spray Place 1 spray into both nostrils as needed for allergies or rhinitis.    Marland Kitchen lisinopril (PRINIVIL,ZESTRIL) 20 MG tablet Take 20 mg by mouth daily.    . metFORMIN (GLUCOPHAGE) 500 MG tablet Take 500 mg by mouth 2 (two) times daily with a meal.    . methocarbamol (ROBAXIN) 500 MG tablet Take 1 tablet (500 mg total) by mouth 3 (three) times daily between meals as needed. 20 tablet 0  . naproxen (NAPROSYN) 500 MG tablet Take 1 tablet (500 mg total) by mouth 2 (two) times daily. 30 tablet 0  . oxybutynin (DITROPAN) 5 MG tablet TAKE 1 TABLET BY MOUTH 4 TIMES A DAY AS  NEEDED FOR BLADDER SPASMS  0  . phenazopyridine (PYRIDIUM) 200 MG tablet Take 1 tablet (200 mg total) by mouth 3 (three) times daily as needed for pain. 45 tablet 0  . terazosin (HYTRIN) 5 MG capsule Take 5 mg by mouth at bedtime.   1  . triamcinolone cream (KENALOG) 0.1 % Apply 1 application topically daily as needed (rash).     . VENTOLIN HFA 108 (90 BASE) MCG/ACT inhaler Inhale 1 puff into the lungs every 4 (four) hours as needed for wheezing or shortness of breath.   2  . HYDROcodone-acetaminophen (NORCO/VICODIN) 5-325 MG tablet Take 2 tablets by mouth every 4 (four) hours as needed. (Patient not taking: Reported on 04/06/2015) 20 tablet 0   No current facility-administered medications for this encounter.    Physical Findings: The patient is in no acute distress. Patient is alert and oriented.  weight is 166 lb 11.2 oz (75.615 kg). His blood pressure is 141/71 and his pulse is 74. His respiration is 16 and oxygen saturation is 100%. .  No significant changes.  Lab Findings: Lab Results  Component Value Date   WBC 5.6 02/08/2015   HGB 11.8* 02/27/2015   HCT 36.5* 02/08/2015   PLT 290 02/08/2015    Lab Results  Component Value Date   NA 140 02/27/2015   K  4.2 02/27/2015   CO2 28 02/27/2015   GLUCOSE 145* 02/27/2015   BUN 13 02/27/2015   CREATININE 0.74 02/27/2015   BILITOT 0.5 02/08/2015   ALKPHOS 53 02/08/2015   AST 13* 02/08/2015   ALT 11* 02/08/2015   PROT 6.9 02/08/2015   ALBUMIN 4.0 02/08/2015   CALCIUM 9.3 02/27/2015   ANIONGAP 6 02/27/2015    Radiographic Findings: No results found.  Impression:  The patient is recovering from the effects of radiation.    Plan:  He will continue to follow-up with urology for ongoing PSA determinations.  I will look forward to following his response through their correspondence, and be happy to participate in care if clinically indicated.  I talked to the patient about what to expect in the future, including his risk for erectile  dysfunction and rectal bleeding.  I encouraged him to call or return to the office if he has any question about his previous radiation or possible radiation effects.  He was comfortable with this plan.   _____________________________________  Sheral Apley. Tammi Klippel, M.D.   This document serves as a record of services personally performed by Tyler Pita, MD. It was created on his behalf by Arlyce Harman, a trained medical scribe. The creation of this record is based on the scribe's personal observations and the provider's statements to them. This document has been checked and approved by the attending provider.

## 2015-04-06 ENCOUNTER — Encounter: Payer: Self-pay | Admitting: Radiation Oncology

## 2015-04-06 ENCOUNTER — Ambulatory Visit
Admission: RE | Admit: 2015-04-06 | Discharge: 2015-04-06 | Disposition: A | Discharge status: Home / Self Care | Payer: Medicare Other | Source: Ambulatory Visit | Attending: Radiation Oncology | Admitting: Radiation Oncology

## 2015-04-06 VITALS — BP 141/71 | HR 74 | Resp 16 | Wt 166.7 lb

## 2015-04-06 DIAGNOSIS — C61 Malignant neoplasm of prostate: Secondary | ICD-10-CM

## 2015-04-06 NOTE — Progress Notes (Addendum)
Weight and vitals stable. Denies pain. Reports intermittent pain associated with urination but, denies this is dysuria. Reports this pain only occurs when he drink caffeine. Denies hematuria. Denies diarrhea. Reports intermittent hesitancy. Confirms he empties his bladder completely. Denies incontinence. Nocturia x 2.  Scheduled to follow up with urologist 04/10/15.  BP 141/71 mmHg  Pulse 74  Resp 16  Wt 166 lb 11.2 oz (75.615 kg)  SpO2 100% Wt Readings from Last 3 Encounters:  04/06/15 166 lb 11.2 oz (75.615 kg)  02/27/15 158 lb 12.8 oz (72.031 kg)  02/10/15 167 lb 11.2 oz (76.068 kg)

## 2016-04-24 ENCOUNTER — Ambulatory Visit: Payer: Self-pay | Admitting: Podiatry

## 2017-02-18 IMAGING — CR DG CHEST 1V PORT
1 series · 1 of 1 positions shown · non-contrast
Comparison: None.

CLINICAL DATA: Midsternal pain.

EXAM:
PORTABLE CHEST 1 VIEW

[AP]
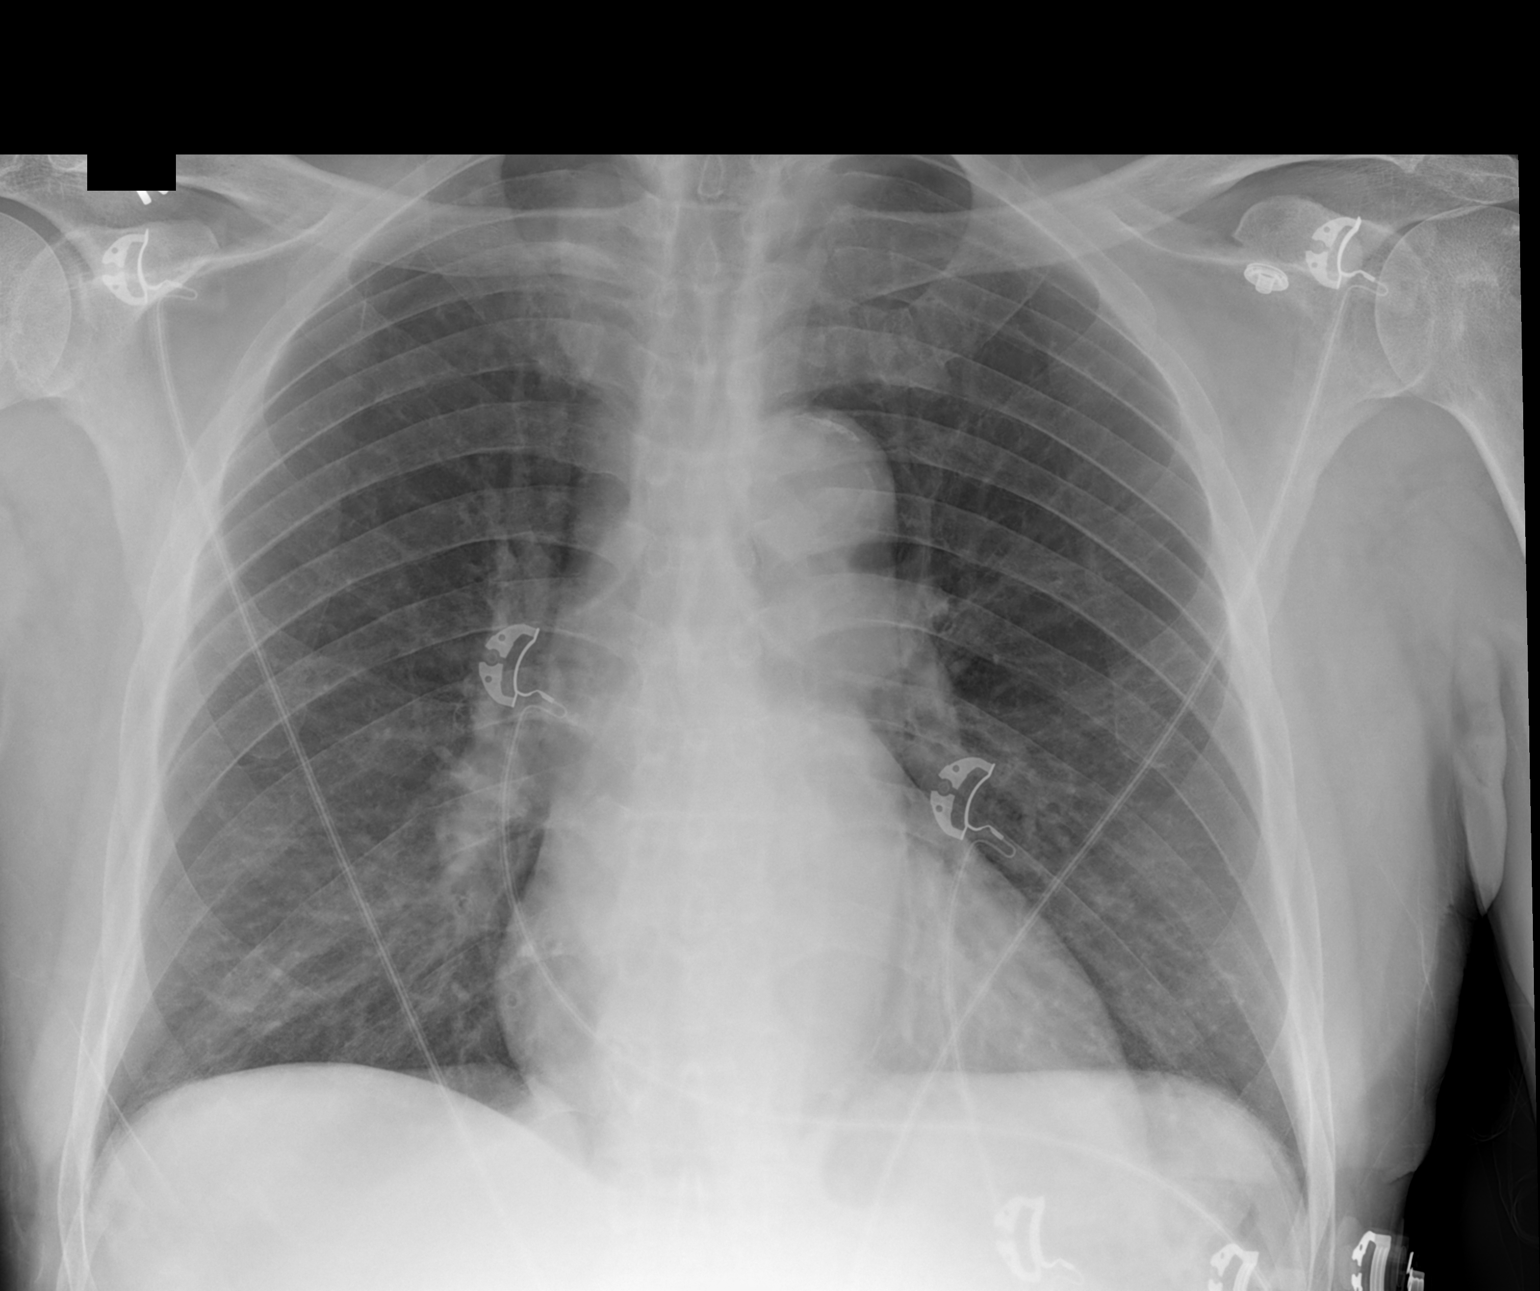

[1 of 1 positions shown; findings below may reference images not displayed]

FINDINGS: The heart size and mediastinal contours are within normal limits.
Both lungs are clear. The visualized skeletal structures are
unremarkable.
IMPRESSION: No active disease.

## 2018-01-06 ENCOUNTER — Other Ambulatory Visit (HOSPITAL_COMMUNITY): Payer: Self-pay | Admitting: Hematology and Oncology

## 2018-01-06 DIAGNOSIS — R609 Edema, unspecified: Secondary | ICD-10-CM

## 2018-01-07 ENCOUNTER — Ambulatory Visit (HOSPITAL_COMMUNITY)
Admission: RE | Admit: 2018-01-07 | Discharge: 2018-01-07 | Disposition: A | Discharge status: Home / Self Care | Payer: Medicare Other | Source: Ambulatory Visit | Attending: Surgery | Admitting: Surgery

## 2018-01-07 DIAGNOSIS — R609 Edema, unspecified: Secondary | ICD-10-CM

## 2018-01-07 DIAGNOSIS — M7989 Other specified soft tissue disorders: Secondary | ICD-10-CM | POA: Diagnosis not present

## 2018-01-07 DIAGNOSIS — C259 Malignant neoplasm of pancreas, unspecified: Secondary | ICD-10-CM | POA: Diagnosis not present

## 2018-01-07 NOTE — Progress Notes (Signed)
Bilateral  Lower extremity venous duplex completed - Preliminary results- There is no evidence of a DVT or Baker's cyst. Toma Copier, RVS 01/07/2018 3:30 PM

## 2018-01-15 ENCOUNTER — Telehealth: Payer: Self-pay | Admitting: *Deleted

## 2018-01-15 NOTE — Telephone Encounter (Signed)
On 01-15-18 fax medical records to [email or web address] a at [email or web address]

## 2018-02-20 DEATH — deceased
